# Patient Record
Sex: Female | Born: 1935
Health system: Southern US, Community
[De-identification: ages and names within clinical notes are randomized; demographics above are authoritative.]

## PROBLEM LIST (undated history)

## (undated) DIAGNOSIS — I10 Essential (primary) hypertension: Secondary | ICD-10-CM

## (undated) DIAGNOSIS — I341 Nonrheumatic mitral (valve) prolapse: Secondary | ICD-10-CM

## (undated) HISTORY — PX: ABDOMINAL HYSTERECTOMY: SHX81

## (undated) HISTORY — PX: CHOLECYSTECTOMY: SHX55

## (undated) HISTORY — PX: APPENDECTOMY: SHX54

---

## 2011-09-11 ENCOUNTER — Emergency Department (HOSPITAL_BASED_OUTPATIENT_CLINIC_OR_DEPARTMENT_OTHER)
Admission: EM | Admit: 2011-09-11 | Discharge: 2011-09-11 | Disposition: A | Discharge status: Home / Self Care | Payer: Medicare Other | Attending: Emergency Medicine | Admitting: Emergency Medicine

## 2011-09-11 ENCOUNTER — Encounter (HOSPITAL_BASED_OUTPATIENT_CLINIC_OR_DEPARTMENT_OTHER): Payer: Self-pay | Admitting: *Deleted

## 2011-09-11 DIAGNOSIS — I10 Essential (primary) hypertension: Secondary | ICD-10-CM | POA: Insufficient documentation

## 2011-09-11 DIAGNOSIS — Z23 Encounter for immunization: Secondary | ICD-10-CM | POA: Insufficient documentation

## 2011-09-11 DIAGNOSIS — S0181XA Laceration without foreign body of other part of head, initial encounter: Secondary | ICD-10-CM

## 2011-09-11 DIAGNOSIS — S0180XA Unspecified open wound of other part of head, initial encounter: Secondary | ICD-10-CM | POA: Insufficient documentation

## 2011-09-11 DIAGNOSIS — Z90710 Acquired absence of both cervix and uterus: Secondary | ICD-10-CM | POA: Insufficient documentation

## 2011-09-11 DIAGNOSIS — W1789XA Other fall from one level to another, initial encounter: Secondary | ICD-10-CM | POA: Insufficient documentation

## 2011-09-11 DIAGNOSIS — Z880 Allergy status to penicillin: Secondary | ICD-10-CM | POA: Insufficient documentation

## 2011-09-11 DIAGNOSIS — Z9089 Acquired absence of other organs: Secondary | ICD-10-CM | POA: Insufficient documentation

## 2011-09-11 DIAGNOSIS — I059 Rheumatic mitral valve disease, unspecified: Secondary | ICD-10-CM | POA: Insufficient documentation

## 2011-09-11 HISTORY — DX: Essential (primary) hypertension: I10

## 2011-09-11 HISTORY — DX: Nonrheumatic mitral (valve) prolapse: I34.1

## 2011-09-11 MED ORDER — TETANUS-DIPHTH-ACELL PERTUSSIS 5-2.5-18.5 LF-MCG/0.5 IM SUSP
0.5000 mL | Freq: Once | INTRAMUSCULAR | Status: AC
  Administered 2011-09-11: 0.5 mL via INTRAMUSCULAR
  Filled 2011-09-11: qty 0.5

## 2011-09-11 NOTE — Discharge Instructions (Signed)
Facial Laceration A facial laceration is a cut on the face. It can take 1 to 2 years for the scar to heal completely. HOME CARE  For stitches (sutures):  Keep the cut clean and dry.   If you have a bandage (dressing), change it at least once a day. Change the bandage if it gets wet or dirty, or as told by your doctor.   Wash the cut with soap and water 2 times a day. Rinse the cut with water. Pat it dry with a clean towel.   Put a thin layer of medicated cream on the cut as told by your doctor.   You may shower after the first 24 hours. Do not soak the cut in water until the stitches are removed.   Only take medicines as told by your doctor.   Have your stitches removed as told by your doctor.   Do not wear makeup until a few days after your stitches are removed.  For skin adhesive strips:  Keep the cut clean and dry.   Do not get the strips wet. You may take a bath, but be careful to keep the cut dry.   If the cut gets wet, pat it dry with a clean towel.   The strips will fall off on their own. Do not remove the strips that are still stuck to the cut.  For wound glue:  You may shower or take baths. Do not soak or scrub the cut. Do not swim. Avoid heavy sweating until the glue falls off on its own. After a shower or bath, pat the cut dry with a clean towel.   Do not put medicine or makeup on your cut until the glue falls off.   If you have a bandage, do not put tape over the glue.   Avoid lots of sunlight or tanning lamps until the glue falls off. Put sunscreen on the cut for the first year to reduce your scar.   The glue will fall off on its own. Do not pick at the glue.  You may need a tetanus shot if:  You cannot remember when you had your last tetanus shot.   You have never had a tetanus shot.  If you need a tetanus shot and you choose not to have one, you may get tetanus. Sickness from tetanus can be serious. GET HELP RIGHT AWAY IF:   Your cut gets red, painful,  or puffy (swollen).   There is yellowish-white fluid (pus) coming from the cut.   You have chills or a fever.  MAKE SURE YOU:   Understand these instructions.   Will watch your condition.   Will get help right away if you are not doing well or get worse.  Document Released: 11/03/2007 Document Revised: 05/06/2011 Document Reviewed: 11/10/2010 [employer] Patient Information 2012 [employer]. 

## 2011-09-11 NOTE — ED Notes (Signed)
Pt states [place] and her foot got tangled up in her pocket book strap. Fell and hit head on cement. No LOC. PERL.

## 2011-09-11 NOTE — ED Provider Notes (Signed)
History     CSN: [id number]  Arrival date & time 09/11/11  1310   First MD Initiated Contact with Patient 09/11/11 1324      Chief Complaint  Patient presents with  . Head Injury    (Consider location/radiation/quality/duration/timing/severity/associated sxs/prior treatment) HPI Patient with Katherine Ewing today from car. She states her feet got tangled up in her pocketbook strap. She fell out of the car landing on the cement. She has an abrasion number of her right thigh. She abraded her right knee. She had no loss of consciousness. She denies any other injuries. She was able to stand and ambulate after the episode. She is not on any anticoagulants or antiplatelet therapy.  She does not know when her last tetanus prophylaxis was. Past Medical History  Diagnosis Date  . Hypertension   . MVP (mitral valve prolapse)     Past Surgical History  Procedure Date  . Abdominal hysterectomy   . Appendectomy   . Cholecystectomy     History reviewed. No pertinent family history.  History  Substance Use Topics  . Smoking status: Never Smoker   . Smokeless tobacco: Not on file  . Alcohol Use: No    OB History    Grav Para Term Preterm Abortions TAB SAB Ect Mult Living                  Review of Systems  All other systems reviewed and are negative.    Allergies  Penicillins  Home Medications   Current Outpatient Rx  Name Route Sig Dispense Refill  . BENAZEPRIL-HYDROCHLOROTHIAZIDE PO Oral Take by mouth.      BP 173/74  Pulse 66  Temp(Src) 98.3 F (36.8 C) (Oral)  Resp 20  Ht 5\' 4"  (1.626 m)  Wt 150 lb (68.04 kg)  BMI 25.75 kg/m2  SpO2 98%  Physical Exam  Nursing note and vitals reviewed. Constitutional: She appears well-developed and well-nourished.  HENT:  Head: Normocephalic.    Right Ear: External ear normal.  Left Ear: External ear normal.       2 cm laceration and abrasion to right temple. This is not through and through the skin.  Eyes: Conjunctivae and EOM  are normal. Pupils are equal, round, and reactive to light.  Neck: Normal range of motion. Neck supple.  Cardiovascular: Normal rate, regular rhythm, normal heart sounds and intact distal pulses.   Pulmonary/Chest: Effort normal and breath sounds normal.  Abdominal: Soft. Bowel sounds are normal.  Musculoskeletal: Normal range of motion.  Neurological: She is alert.  Skin: Skin is warm and dry.  Psychiatric: She has a normal mood and affect. Thought content normal.    ED Course  Procedures (including critical care time)  Labs Reviewed - No data to display No results found.   No diagnosis found.    MDM          Hilario Quarry, MD 09/11/11 1416

## 2013-09-25 DIAGNOSIS — E785 Hyperlipidemia, unspecified: Secondary | ICD-10-CM | POA: Diagnosis present

## 2013-09-25 DIAGNOSIS — I10 Essential (primary) hypertension: Secondary | ICD-10-CM | POA: Diagnosis present

## 2013-09-25 DIAGNOSIS — I480 Paroxysmal atrial fibrillation: Secondary | ICD-10-CM | POA: Diagnosis present

## 2018-07-17 DIAGNOSIS — I5023 Acute on chronic systolic (congestive) heart failure: Secondary | ICD-10-CM | POA: Diagnosis present

## 2018-07-17 DIAGNOSIS — I5022 Chronic systolic (congestive) heart failure: Secondary | ICD-10-CM | POA: Diagnosis present

## 2018-10-18 DIAGNOSIS — E039 Hypothyroidism, unspecified: Secondary | ICD-10-CM | POA: Diagnosis present

## 2019-09-28 ENCOUNTER — Emergency Department (HOSPITAL_BASED_OUTPATIENT_CLINIC_OR_DEPARTMENT_OTHER): Payer: Medicare HMO

## 2019-09-28 ENCOUNTER — Other Ambulatory Visit: Payer: Self-pay

## 2019-09-28 ENCOUNTER — Encounter (HOSPITAL_BASED_OUTPATIENT_CLINIC_OR_DEPARTMENT_OTHER): Payer: Self-pay | Admitting: *Deleted

## 2019-09-28 ENCOUNTER — Emergency Department (HOSPITAL_BASED_OUTPATIENT_CLINIC_OR_DEPARTMENT_OTHER)
Admission: EM | Admit: 2019-09-28 | Discharge: 2019-09-28 | Disposition: A | Discharge status: Home / Self Care | Payer: Medicare HMO | Attending: Emergency Medicine | Admitting: Emergency Medicine

## 2019-09-28 DIAGNOSIS — S6992XA Unspecified injury of left wrist, hand and finger(s), initial encounter: Secondary | ICD-10-CM | POA: Diagnosis present

## 2019-09-28 DIAGNOSIS — I10 Essential (primary) hypertension: Secondary | ICD-10-CM | POA: Insufficient documentation

## 2019-09-28 DIAGNOSIS — Y92002 Bathroom of unspecified non-institutional (private) residence single-family (private) house as the place of occurrence of the external cause: Secondary | ICD-10-CM | POA: Diagnosis not present

## 2019-09-28 DIAGNOSIS — Z79899 Other long term (current) drug therapy: Secondary | ICD-10-CM | POA: Diagnosis not present

## 2019-09-28 DIAGNOSIS — Y999 Unspecified external cause status: Secondary | ICD-10-CM | POA: Insufficient documentation

## 2019-09-28 DIAGNOSIS — S62185A Nondisplaced fracture of trapezoid [smaller multangular], left wrist, initial encounter for closed fracture: Secondary | ICD-10-CM | POA: Insufficient documentation

## 2019-09-28 DIAGNOSIS — S62175A Nondisplaced fracture of trapezium [larger multangular], left wrist, initial encounter for closed fracture: Secondary | ICD-10-CM | POA: Insufficient documentation

## 2019-09-28 DIAGNOSIS — W010XXA Fall on same level from slipping, tripping and stumbling without subsequent striking against object, initial encounter: Secondary | ICD-10-CM | POA: Diagnosis not present

## 2019-09-28 DIAGNOSIS — Y9301 Activity, walking, marching and hiking: Secondary | ICD-10-CM | POA: Insufficient documentation

## 2019-09-28 NOTE — ED Notes (Signed)
ED Provider at bedside. 

## 2019-09-28 NOTE — ED Notes (Signed)
Patient transported to X-ray 

## 2019-09-28 NOTE — ED Triage Notes (Signed)
She fell 2 days ago. Does not know why she fell. Injury to her left wrist with significant bruising noted.

## 2019-09-28 NOTE — ED Provider Notes (Signed)
[facility] EMERGENCY DEPARTMENT Provider Note   CSN: [id number] Arrival date & time: 09/28/19  1431     History Chief Complaint  Patient presents with  . Fall  . Wrist Injury    Irania Ewing is a 86 y.o. female.  HPI      Presents with concern for fall Was walking to bathroom from chair and had no problems walking, not dizzy, looked into mirror then fell No dizziness, no syncope, don't think tripped, no problems walking after incident, was able to get up on her own No head trauma, no neck pain, no numbness/weakness/chest pain/shortness of breath Occurred 7PM Wednesday night No headaches Wrist pain has been moderate and persistent, worse with movements. Wants to know if it is broken. Denies other injuries.  Is on blood thinners No other acute concerns    Past Medical History:  Diagnosis Date  . Hypertension   . MVP (mitral valve prolapse)     There are no problems to display for this patient.   Past Surgical History:  Procedure Laterality Date  . ABDOMINAL HYSTERECTOMY    . APPENDECTOMY    . CHOLECYSTECTOMY       OB History   No obstetric history on file.     No family history on file.  Social History   Tobacco Use  . Smoking status: Never Smoker  . Smokeless tobacco: Never Used  Substance Use Topics  . Alcohol use: No  . Drug use: No    Home Medications Prior to Admission medications   Medication Sig Start Date End Date Taking? Authorizing Provider  amiodarone (PACERONE) 200 MG tablet Take by mouth. 06/12/19  Yes [provider]  apixaban (ELIQUIS) 5 MG TABS tablet Take by mouth. 06/12/19  Yes [provider]  diltiazem (TIAZAC) 300 MG 24 hr capsule Take by mouth. 09/17/19  Yes [provider]  dorzolamide-timolol (COSOPT) 22.3-6.8 MG/ML ophthalmic solution INSTILL 1 DROP INTO EACH EYE TWICE DAILY 12/18/18  Yes [provider]  furosemide (LASIX) 40 MG tablet Take by mouth. 12/19/18  Yes [provider]  latanoprost (XALATAN) 0.005 % ophthalmic solution INSTILL 1 DROP INTO EACH EYE NIGHTLY 09/06/18  Yes [provider]  levothyroxine (SYNTHROID) 50 MCG tablet Take by mouth. 07/16/19  Yes [provider]  metoprolol succinate (TOPROL-XL) 100 MG 24 hr tablet Take 1.5 tablets every day 06/12/19  Yes [provider]  ondansetron (ZOFRAN) 8 MG tablet Take by mouth. 12/31/16  Yes [provider]  pilocarpine (PILOCAR) 1 % ophthalmic solution Place 1 drop into the right eye 3 times daily. 08/23/19  Yes [provider]  primidone (MYSOLINE) 250 MG tablet Take by mouth. 03/19/19  Yes [provider]  spironolactone (ALDACTONE) 25 MG tablet Take by mouth. 04/24/19  Yes [provider]  BENAZEPRIL-HYDROCHLOROTHIAZIDE PO Take by mouth.    [provider]    Allergies    Penicillins  Review of Systems   Review of Systems  Constitutional: Negative for fever.  Respiratory: Negative for shortness of breath.   Cardiovascular: Negative for chest pain.  Gastrointestinal: Negative for abdominal pain, nausea and vomiting.  Musculoskeletal: Positive for arthralgias. Negative for back pain and neck pain.  Skin: Positive for color change (bruising). Negative for wound.  Neurological: Negative for dizziness, seizures, syncope, facial asymmetry, weakness, light-headedness, numbness and headaches.    Physical Exam Updated Vital Signs BP 120/79 (BP Location: Right Arm)   Pulse 61   Temp 98.5 F (36.9 C) (  Oral)   Resp 20   Ht 5\' 2"  (1.575 m)   Wt 59 kg   SpO2 97%   BMI 23.78 kg/m   Physical Exam Vitals and nursing note reviewed.  Constitutional:      General: She is not in acute distress.    Appearance: She is well-developed. She is not diaphoretic.  HENT:     Head: Normocephalic and atraumatic.  Eyes:     Conjunctiva/sclera: Conjunctivae normal.  Cardiovascular:     Rate and Rhythm: Normal rate and regular rhythm.      Pulses: Normal pulses.  Pulmonary:     Effort: Pulmonary effort is normal. No respiratory distress.  Musculoskeletal:        General: Tenderness (right carpal bones on radial side of hand, mild snuff box tenderness, contusion over hand/wrist, no deformity, normal ROM, normal sensation of fingers, normal opponens/finger abduction/extension wrist) present.     Cervical back: Normal range of motion. No tenderness.  Skin:    General: Skin is warm and dry.     Findings: Bruising present. No erythema or rash.  Neurological:     Mental Status: She is alert and oriented to person, place, and time.     ED Results / Procedures / Treatments   Labs (all labs ordered are listed, but only abnormal results are displayed) Labs Reviewed - No data to display  EKG None  Radiology DG Wrist Complete Left  Result Date: 09/28/2019 CLINICAL DATA:  Fall EXAM: LEFT WRIST - COMPLETE 3+ VIEW COMPARISON:  None. FINDINGS: There is a background of advanced degenerative arthrosis at the first carpometacarpal and triscaphe joints with slight volar positioning of the trapezium and trapezoid. As well as some questionable fragmentation near the base of the second metatarsal. No other acute osseous abnormality is evident. More mild degenerative changes at the radiocarpal joint are noted. There is slight negative ulnar variance. Interphalangeal arthrosis is present as well noted incidentally on these images. Mild circumferential swelling of the wrist is seen. IMPRESSION: Advanced degenerative changes may limit detection of small, nondisplaced fractures. Question some irregularity of the trapezium and trapezoid. Could be further assessed with cross-sectional imaging. Electronically Signed   By: Lovena Le M.D.   On: 09/28/2019 15:32    Procedures Procedures (including critical care time)  Medications Ordered in ED Medications - No data to display  ED Course  I have reviewed the triage vital signs and the nursing  notes.  Pertinent labs & imaging results that were available during my care of the patient were reviewed by me and considered in my medical decision making (see chart for details).    MDM Rules/Calculators/A&P                      86yo female with history above of eliquis presents with concern for fall 3 days ago with left wrist pain.  No head trauma, headache and 3 days after event and have low suspicion for intracranial bleed.  No history or physical findings to suggest other injuries, including low suspicion for cervical spine injury.  Wrist NV intact. XR shows possible fx/irregularities of trapezoid and trapezium with limitation of osteopenia and arthritis. She does have tenderness in this area and suspect these likely represent fracture and will place in thumb spica and refer to hand surgery for further evaluation. Patient discharged in stable condition with understanding of reasons to return.   Final Clinical Impression(s) / ED Diagnoses Final diagnoses:  Closed nondisplaced fracture of  trapezium of left wrist, initial encounter  Closed nondisplaced fracture of trapezoid of left wrist, initial encounter    Rx / DC Orders ED Discharge Orders    None       Alvira Monday, MD 09/28/19 1821

## 2019-10-22 ENCOUNTER — Emergency Department (HOSPITAL_BASED_OUTPATIENT_CLINIC_OR_DEPARTMENT_OTHER)
Admission: EM | Admit: 2019-10-22 | Discharge: 2019-10-22 | Disposition: A | Discharge status: Home / Self Care | Payer: Medicare HMO | Attending: Emergency Medicine | Admitting: Emergency Medicine

## 2019-10-22 ENCOUNTER — Other Ambulatory Visit: Payer: Self-pay

## 2019-10-22 ENCOUNTER — Emergency Department (HOSPITAL_BASED_OUTPATIENT_CLINIC_OR_DEPARTMENT_OTHER): Payer: Medicare HMO

## 2019-10-22 ENCOUNTER — Encounter (HOSPITAL_BASED_OUTPATIENT_CLINIC_OR_DEPARTMENT_OTHER): Payer: Self-pay | Admitting: Emergency Medicine

## 2019-10-22 DIAGNOSIS — Z7901 Long term (current) use of anticoagulants: Secondary | ICD-10-CM | POA: Insufficient documentation

## 2019-10-22 DIAGNOSIS — M79642 Pain in left hand: Secondary | ICD-10-CM | POA: Diagnosis present

## 2019-10-22 DIAGNOSIS — Z880 Allergy status to penicillin: Secondary | ICD-10-CM | POA: Diagnosis not present

## 2019-10-22 DIAGNOSIS — Z79899 Other long term (current) drug therapy: Secondary | ICD-10-CM | POA: Insufficient documentation

## 2019-10-22 MED ORDER — OXYCODONE-ACETAMINOPHEN 5-325 MG PO TABS
1.0000 | ORAL_TABLET | Freq: Once | ORAL | Status: AC
  Administered 2019-10-22: 1 via ORAL
  Filled 2019-10-22: qty 1

## 2019-10-22 MED ORDER — TRAMADOL HCL 50 MG PO TABS: 50.0000 mg | ORAL_TABLET | Freq: Four times a day (QID) | ORAL | 0 refills | Status: DC | PRN

## 2019-10-22 MED ORDER — PREDNISONE 20 MG PO TABS: 40.0000 mg | ORAL_TABLET | Freq: Every day | ORAL | 0 refills | Status: DC

## 2019-10-22 MED FILL — traMADol HCL 50 MG TABS: 50 | 3 days supply | Qty: 15 | Fill #0

## 2019-10-22 MED FILL — predniSONE 20 MG TABS: 20 | 5 days supply | Qty: 10 | Fill #0

## 2019-10-22 NOTE — ED Triage Notes (Signed)
Pt states her arm pain started to get worse on Saturday.  No new injuries.  Pt did not follow up with ortho.

## 2019-10-22 NOTE — ED Provider Notes (Signed)
[facility] EMERGENCY DEPARTMENT Provider Note   CSN: [id number] Arrival date & time: 10/22/19  1009     History Chief Complaint  Patient presents with  . Arm Pain    Katherine Ewing is a 86 y.o. female.  HPI 86 year old Caucasian female with past medical history significant for hypertension presents the ER for evaluation of left hand pain.  Patient was seen in the ER on 4/30 after mechanical fall where she hit her left hand.  Patient had a questionable left hand fracture at that time.  She was placed into a Velcro splint for [place] to follow-up with orthopedics.  Patient reports that she did not follow-up with orthopedics.  She reports that she was feeling better until 2 days ago when she started having intense pain to her left hand.  Has been taken Tylenol without any significant relief.  No new injuries that she knows of.  No numbness or tingling.  Patient states that she has been wearing her brace at home.  Patient reports the pain is excruciating.  She rates the pain a 30/10.  Denies any fevers or chills.  States the pain is worse with palpation and range of motion.    Past Medical History:  Diagnosis Date  . Hypertension   . MVP (mitral valve prolapse)     There are no problems to display for this patient.   Past Surgical History:  Procedure Laterality Date  . ABDOMINAL HYSTERECTOMY    . APPENDECTOMY    . CHOLECYSTECTOMY       OB History   No obstetric history on file.     No family history on file.  Social History   Tobacco Use  . Smoking status: Never Smoker  . Smokeless tobacco: Never Used  Substance Use Topics  . Alcohol use: No  . Drug use: No    Home Medications Prior to Admission medications   Medication Sig Start Date End Date Taking? Authorizing Provider  amiodarone (PACERONE) 200 MG tablet Take by mouth. 06/12/19   [provider]  apixaban (ELIQUIS) 5 MG TABS tablet Take by mouth. 06/12/19   [provider]    BENAZEPRIL-HYDROCHLOROTHIAZIDE PO Take by mouth.    [provider]  diltiazem (TIAZAC) 300 MG 24 hr capsule Take by mouth. 09/17/19   [provider]  dorzolamide-timolol (COSOPT) 22.3-6.8 MG/ML ophthalmic solution INSTILL 1 DROP INTO EACH EYE TWICE DAILY 12/18/18   [provider]  furosemide (LASIX) 40 MG tablet Take by mouth. 12/19/18   [provider]  latanoprost (XALATAN) 0.005 % ophthalmic solution INSTILL 1 DROP INTO EACH EYE NIGHTLY 09/06/18   [provider]  levothyroxine (SYNTHROID) 50 MCG tablet Take by mouth. 07/16/19   [provider]  metoprolol succinate (TOPROL-XL) 100 MG 24 hr tablet Take 1.5 tablets every day 06/12/19   [provider]  ondansetron (ZOFRAN) 8 MG tablet Take by mouth. 12/31/16   [provider]  pilocarpine (PILOCAR) 1 % ophthalmic solution Place 1 drop into the right eye 3 times daily. 08/23/19   [provider]  primidone (MYSOLINE) 250 MG tablet Take by mouth. 03/19/19   [provider]  spironolactone (ALDACTONE) 25 MG tablet Take by mouth. 04/24/19   [provider]    Allergies    Penicillins  Review of Systems   Review of Systems  Constitutional: Negative for chills and fever.  HENT: Negative for congestion.   Eyes: Negative for discharge.  Gastrointestinal: Negative for nausea and vomiting.  Musculoskeletal: Positive for arthralgias and myalgias.  Skin: Negative for color change.  Neurological: Negative for weakness and numbness.  Psychiatric/Behavioral: Negative for confusion.    Physical Exam Updated Vital Signs BP (!) 141/77 (BP Location: Right Arm)   Pulse 77   Temp 98.7 F (37.1 C) (Oral)   Resp 18   Ht 5\' 2"  (1.575 m)   Wt 59 kg   SpO2 96%   BMI 23.78 kg/m   Physical Exam Vitals and nursing note reviewed.  Constitutional:      General: She is not in acute distress.    Appearance: She is well-developed. She is not ill-appearing or  toxic-appearing.  HENT:     Head: Normocephalic and atraumatic.  Eyes:     General: No scleral icterus.       Right eye: No discharge.        Left eye: No discharge.  Cardiovascular:     Pulses: Normal pulses.  Pulmonary:     Effort: No respiratory distress.  Musculoskeletal:        General: Normal range of motion.     Cervical back: Normal range of motion.     Comments: Patient in Velcro splint.  This was removed.  Patient does have swelling to the dorsum of the left hand.  There is no erythema or warmth.  Radial pulses are 2+.  Capillary refill is normal.  Sensation is intact.  Patient has pain with range of motion and palpation over the left wrist and left hand.   No pain with range of motion or palpation of the forearm or elbow.  Skin compartments are soft.   Skin:    General: Skin is warm and dry.     Coloration: Skin is not pale.  Neurological:     Mental Status: She is alert.  Psychiatric:        Behavior: Behavior normal.        Thought Content: Thought content normal.        Judgment: Judgment normal.     ED Results / Procedures / Treatments   Labs (all labs ordered are listed, but only abnormal results are displayed) Labs Reviewed - No data to display  EKG None  Radiology DG Hand Complete Left  Result Date: 10/22/2019 CLINICAL DATA:  Left hand pain EXAM: LEFT HAND - COMPLETE 3+ VIEW COMPARISON:  09/28/2019 FINDINGS: Advanced osteoarthritis at the 1st carpometacarpal joint and in the DIP joints. Irregularity noted in the trapezium likely degenerative. No visible acute fracture, subluxation or dislocation. IMPRESSION: No acute bony abnormality. Electronically Signed   By: 09/30/2019 M.D.   On: 10/22/2019 10:55    Procedures Procedures (including critical care time)  Medications Ordered in ED Medications  oxyCODONE-acetaminophen (PERCOCET/ROXICET) 5-325 MG per tablet 1 tablet (has no administration in time range)    ED Course  I have reviewed the triage  vital signs and the nursing notes.  Pertinent labs & imaging results that were available during my care of the patient were reviewed by me and considered in my medical decision making (see chart for details).    MDM Rules/Calculators/A&P                      86 year old presents the ER for evaluation of of left hand pain.  Injury 2 weeks ago.  Did not follow-up with orthopedist.  Patient has been wearing splint.  Pain improved and then worsened 2 days ago.  Patient is neurovascularly intact.  There  is no erythema or warmth and no systemic signs of infection to suspect a septic arthritis.  No history of gout.  The patient's x-ray today shows no acute fracture or malalignment.  Patient does have some swelling to the dorsum of the left hand.  Patient will need orthopedic hand follow-up.  Have given a referral.  Case was discussed with my attending Dr. Wilson Singer.  He recommends patient placed on a short course of prednisone.  I will also give patient short course of pain medication.  Narcotic database was reviewed for the past 12 months.  She did receive tramadol several months ago.  Will start patient on tramadol along with prednisone.  Discussed with patient that tramadol can make her drowsy is to be careful taking this and make sure that she has somebody at home who can watch her.  She will be at increased fall risk with narcotic medications.  Patient verbalized understanding.  Pt is hemodynamically stable, in NAD, & able to ambulate in the ED. Evaluation does not show pathology that would require ongoing emergent intervention or inpatient treatment. I explained the diagnosis to the patient. Pain has been managed & has no complaints prior to dc. Pt is comfortable with above plan and is stable for discharge at this time. All questions were answered prior to disposition. Strict return precautions for f/u to the ED were discussed. Encouraged follow up with PCP.  Final Clinical Impression(s) / ED Diagnoses Final  diagnoses:  Left hand pain    Rx / DC Orders ED Discharge Orders         Ordered    traMADol (ULTRAM) 50 MG tablet  Every 6 hours PRN,   Status:  Discontinued     10/22/19 1131    predniSONE (DELTASONE) 20 MG tablet  Daily,   Status:  Discontinued     10/22/19 1131    predniSONE (DELTASONE) 20 MG tablet  Daily     10/22/19 1132    traMADol (ULTRAM) 50 MG tablet  Every 6 hours PRN     10/22/19 1132           Aaron Edelman 10/22/19 1233    Virgel Manifold, MD 10/22/19 1335

## 2019-10-22 NOTE — Discharge Instructions (Signed)
Your x-ray did not show any fracture today.  Wear the splint for comfort.  Continue to ice and elevate the area.  We will give a short course of pain medication.  This medication can make you drowsy to be careful with taking it.  Make sure that semiis home watching when take this medication we will also give you steroids to help with inflammation.  Please call the hand doctor today to follow-up.  Return to the ER if you develop any worsening symptoms including worsening pain, fevers or for any other reason.

## 2019-10-22 NOTE — ED Notes (Signed)
ED Provider at bedside. Family at bedside.  

## 2019-10-27 ENCOUNTER — Other Ambulatory Visit: Payer: Self-pay

## 2019-10-27 ENCOUNTER — Encounter (HOSPITAL_BASED_OUTPATIENT_CLINIC_OR_DEPARTMENT_OTHER): Payer: Self-pay | Admitting: Emergency Medicine

## 2019-10-27 DIAGNOSIS — W19XXXD Unspecified fall, subsequent encounter: Secondary | ICD-10-CM | POA: Insufficient documentation

## 2019-10-27 DIAGNOSIS — Z79899 Other long term (current) drug therapy: Secondary | ICD-10-CM | POA: Diagnosis not present

## 2019-10-27 DIAGNOSIS — M79632 Pain in left forearm: Secondary | ICD-10-CM | POA: Diagnosis present

## 2019-10-27 DIAGNOSIS — S52552D Other extraarticular fracture of lower end of left radius, subsequent encounter for closed fracture with routine healing: Secondary | ICD-10-CM | POA: Diagnosis not present

## 2019-10-27 DIAGNOSIS — G5612 Other lesions of median nerve, left upper limb: Secondary | ICD-10-CM | POA: Diagnosis not present

## 2019-10-27 DIAGNOSIS — I10 Essential (primary) hypertension: Secondary | ICD-10-CM | POA: Diagnosis not present

## 2019-10-27 NOTE — ED Triage Notes (Signed)
Pt states she broke her L arm 3 weeks ago. States "it wasn't that bad of a break but I damaged the nerves in my arm so bad." Pt states she was seen on Wednesday morning and received "an injection" that really helped but it didn't last.

## 2019-10-28 ENCOUNTER — Emergency Department (HOSPITAL_BASED_OUTPATIENT_CLINIC_OR_DEPARTMENT_OTHER): Payer: Medicare HMO

## 2019-10-28 ENCOUNTER — Emergency Department (HOSPITAL_BASED_OUTPATIENT_CLINIC_OR_DEPARTMENT_OTHER)
Admission: EM | Admit: 2019-10-28 | Discharge: 2019-10-28 | Disposition: A | Discharge status: Home / Self Care | Payer: Medicare HMO | Attending: Emergency Medicine | Admitting: Emergency Medicine

## 2019-10-28 DIAGNOSIS — G5612 Other lesions of median nerve, left upper limb: Secondary | ICD-10-CM

## 2019-10-28 DIAGNOSIS — S52552S Other extraarticular fracture of lower end of left radius, sequela: Secondary | ICD-10-CM

## 2019-10-28 MED ORDER — HYDROCODONE-ACETAMINOPHEN 5-325 MG PO TABS: 0.5000 | ORAL_TABLET | ORAL | 0 refills | Status: DC | PRN

## 2019-10-28 MED ORDER — BUPIVACAINE-EPINEPHRINE (PF) 0.5% -1:200000 IJ SOLN
1.8000 mL | Freq: Once | INTRAMUSCULAR | Status: AC
Start: 2019-10-28 — End: 2019-10-28
  Administered 2019-10-28: 1.8 mL
  Filled 2019-10-28: qty 1.8

## 2019-10-28 MED ORDER — HYDROCODONE-ACETAMINOPHEN 5-325 MG PO TABS
0.5000 | ORAL_TABLET | Freq: Once | ORAL | Status: AC
  Administered 2019-10-28: 0.5 via ORAL
  Filled 2019-10-28: qty 1

## 2019-10-28 NOTE — ED Notes (Signed)
Pt taken to CT and returned 

## 2019-10-28 NOTE — ED Notes (Signed)
Dr. Molpus, ED Provider at bedside. 

## 2019-10-28 NOTE — ED Provider Notes (Signed)
[facility] Provider Note: Lowella Dell, MD, FACEP  CSN: [id number] MRN: [id number] ARRIVAL: 10/27/19 at 2221 ROOM: MH02/MH02   CHIEF COMPLAINT  Arm Pain   HISTORY OF PRESENT ILLNESS  10/28/19 12:47 AM Katherine Ewing is a 86 y.o. female who fell 09/28/2019 and was diagnosed with a left wrist injury.  Initial radiographs were suspicious for fractures of the trapezium and trapezius.  CT scan was suggested but was not done.  Follow-up radiographs on 10/22/2019 were negative for fracture.  She has had persistent pain in her left median nerve distribution (first second and third fingers) sometimes radiating proximally since the injury.  She had an injection of her medial wrist (the location the patient indicates suggest the median nerve) by Dr. Roney Mans 3 days ago.  This gave her significant relief of pain but the pain is returned and she now rates it as a "20 or 30 out of 10".  She has a Lidoderm patch on her dorsal left hand which she states helps significantly.  Past Medical History:  Diagnosis Date  . Hypertension   . MVP (mitral valve prolapse)     Past Surgical History:  Procedure Laterality Date  . ABDOMINAL HYSTERECTOMY    . APPENDECTOMY    . CHOLECYSTECTOMY      No family history on file.  Social History   Tobacco Use  . Smoking status: Never Smoker  . Smokeless tobacco: Never Used  Substance Use Topics  . Alcohol use: No  . Drug use: No    Prior to Admission medications   Medication Sig Start Date End Date Taking? Authorizing Provider  amiodarone (PACERONE) 200 MG tablet Take by mouth. 06/12/19   [provider]  apixaban (ELIQUIS) 5 MG TABS tablet Take by mouth. 06/12/19   [provider]  BENAZEPRIL-HYDROCHLOROTHIAZIDE PO Take by mouth.    [provider]  diltiazem (TIAZAC) 300 MG 24 hr capsule Take by mouth. 09/17/19   [provider]  dorzolamide-timolol (COSOPT) 22.3-6.8 MG/ML ophthalmic solution INSTILL 1 DROP INTO  EACH EYE TWICE DAILY 12/18/18   [provider]  furosemide (LASIX) 40 MG tablet Take by mouth. 12/19/18   [provider]  HYDROcodone-acetaminophen (NORCO) 5-325 MG tablet Take 0.5 tablets by mouth every 4 (four) hours as needed for severe pain. 10/28/19   Esme Durkin, MD  latanoprost (XALATAN) 0.005 % ophthalmic solution INSTILL 1 DROP INTO EACH EYE NIGHTLY 09/06/18   [provider]  levothyroxine (SYNTHROID) 50 MCG tablet Take by mouth. 07/16/19   [provider]  metoprolol succinate (TOPROL-XL) 100 MG 24 hr tablet Take 1.5 tablets every day 06/12/19   [provider]  ondansetron (ZOFRAN) 8 MG tablet Take by mouth. 12/31/16   [provider]  pilocarpine (PILOCAR) 1 % ophthalmic solution Place 1 drop into the right eye 3 times daily. 08/23/19   [provider]  predniSONE (DELTASONE) 20 MG tablet Take 2 tablets (40 mg total) by mouth daily. 10/22/19   Rise Mu, PA-C  primidone (MYSOLINE) 250 MG tablet Take by mouth. 03/19/19   [provider]  spironolactone (ALDACTONE) 25 MG tablet Take by mouth. 04/24/19   [provider]    Allergies Penicillins and Adhesive  [tape]   REVIEW OF SYSTEMS  Negative except as noted here or in the History of Present Illness.   PHYSICAL EXAMINATION  Initial Vital Signs Blood pressure 123/85, pulse 83, temperature 98.8 F (37.1 C), temperature source Oral, resp. rate 20, height 5'  2" (1.575 m), weight 59 kg, SpO2 97 %.  Examination General: Well-developed, well-nourished female in no acute distress; appearance consistent with age of record HENT: normocephalic; atraumatic Eyes: pupils equal, round and reactive to light; extraocular muscles intact Neck: supple Heart: regular rate and rhythm Lungs: clear to auscultation bilaterally Abdomen: soft; nondistended; nontender; bowel sounds present Extremities: No deformity; left wrist in Velcro splint, on removal patient has  normal movement and appearance of fingers with pain in the median nerve distribution, negative Tinel's test; normal pulses; trace edema of lower legs Neurologic: Awake, alert and oriented; motor function intact in all extremities and symmetric; no facial droop Skin: Warm and dry Psychiatric: Angry mood with congruent affect   RESULTS  Summary of this visit's results, reviewed and interpreted by myself:   EKG Interpretation  Date/Time:    Ventricular Rate:    PR Interval:    QRS Duration:   QT Interval:    QTC Calculation:   R Axis:     Text Interpretation:        Laboratory Studies: No results found for this or any previous visit (from the past 24 hour(s)). Imaging Studies: CT Wrist Left Wo Contrast  Result Date: 10/28/2019 CLINICAL DATA:  Injury 09/28/2019, persistent pain. EXAM: CT OF THE LEFT WRIST WITHOUT CONTRAST TECHNIQUE: Multidetector CT imaging was performed according to the standard protocol. Multiplanar CT image reconstructions were also generated. COMPARISON:  Radiograph 09/28/2019, hand radiograph 10/22/2019 FINDINGS: Bones/Joint/Cartilage Transverse sclerosis involving the distal radius most consistent with healing nondisplaced fracture. This is in the region of the physeal scar and extends to the articular surface. Minimal nondisplaced fracture line may be present involving the radial styloid. No additional acute or healing fracture. There is osteoarthritis of the base of the thumb involving the carpal metacarpal joint. Minimal wrist joint effusion. No evidence of erosion or bony destruction. Ligaments Suboptimally assessed by CT. Muscles and Tendons No significant tenosynovial fluid.  No intramuscular collection. Soft tissues Mild soft tissue edema about the wrist. Possible dressing overlying the dorsum. IMPRESSION: 1. Transverse sclerosis involving the distal radius most consistent with healing nondisplaced fracture. This is in the region of the physeal scar and extends to  the articular surface. A nondisplaced fracture line may be present involving the radial styloid. 2. Osteoarthritis of the base of the thumb involving the carpal metacarpal joint. 3. Minimal wrist joint effusion. Electronically Signed   By: Keith Rake M.D.   On: 10/28/2019 01:48    ED COURSE and MDM  Nursing notes, initial and subsequent vitals signs, including pulse oximetry, reviewed and interpreted by myself.  Vitals:   10/27/19 2232 10/27/19 2234 10/28/19 0134  BP: 123/85  (!) 134/98  Pulse: 83  83  Resp: 20  20  Temp: 98.8 F (37.1 C)    TempSrc: Oral    SpO2: 97%  98%  Weight:  59 kg   Height:  5\' 2"  (1.575 m)    Medications  bupivacaine-epinephrine (MARCAINE W/ EPI) 0.5% -1:200000 injection 1.8 mL (has no administration in time range)  HYDROcodone-acetaminophen (NORCO/VICODIN) 5-325 MG per tablet 0.5 tablet (0.5 tablets Oral Given 10/28/19 0133)   2:21 AM Patient's pain improved after median nerve block.  CT consistent with healing distal radius fracture.  Patient not getting relief with tramadol, will switch to low-dose hydrocodone with advised that she may wish to take a laxative to prevent constipation.   PROCEDURES  Procedures  MEDIAN NERVE BLOCK After informed verbal consent was obtained, 1.8 milliliters of 0.5%  bupivacaine with epinephrine were injected into left volar wrist between the flexor carpi radialis and flexor profundus digitorum tendons. The patient tolerated this well and there were no immediate complications. Adequate analgesia was obtained.   ED DIAGNOSES     ICD-10-CM   1. Other closed extra-articular fracture of distal end of left radius, sequela  S52.552S   2. Median nerve neuritis, left  G56.12        Kasee Hantz, Jonny Ruiz, MD 10/28/19 [phone]

## 2020-01-30 ENCOUNTER — Encounter (HOSPITAL_BASED_OUTPATIENT_CLINIC_OR_DEPARTMENT_OTHER): Payer: Self-pay | Admitting: Emergency Medicine

## 2020-01-30 ENCOUNTER — Observation Stay (HOSPITAL_BASED_OUTPATIENT_CLINIC_OR_DEPARTMENT_OTHER)
Admission: EM | Admit: 2020-01-30 | Discharge: 2020-02-01 | Disposition: A | Discharge status: Home / Self Care | Payer: Medicare HMO | Attending: Internal Medicine | Admitting: Internal Medicine

## 2020-01-30 ENCOUNTER — Other Ambulatory Visit: Payer: Self-pay

## 2020-01-30 DIAGNOSIS — I480 Paroxysmal atrial fibrillation: Secondary | ICD-10-CM | POA: Diagnosis present

## 2020-01-30 DIAGNOSIS — E785 Hyperlipidemia, unspecified: Secondary | ICD-10-CM | POA: Insufficient documentation

## 2020-01-30 DIAGNOSIS — Z79899 Other long term (current) drug therapy: Secondary | ICD-10-CM | POA: Insufficient documentation

## 2020-01-30 DIAGNOSIS — Z9049 Acquired absence of other specified parts of digestive tract: Secondary | ICD-10-CM | POA: Diagnosis not present

## 2020-01-30 DIAGNOSIS — E039 Hypothyroidism, unspecified: Secondary | ICD-10-CM | POA: Insufficient documentation

## 2020-01-30 DIAGNOSIS — Z20822 Contact with and (suspected) exposure to covid-19: Secondary | ICD-10-CM | POA: Diagnosis not present

## 2020-01-30 DIAGNOSIS — K921 Melena: Secondary | ICD-10-CM | POA: Diagnosis not present

## 2020-01-30 DIAGNOSIS — K625 Hemorrhage of anus and rectum: Secondary | ICD-10-CM

## 2020-01-30 DIAGNOSIS — I5022 Chronic systolic (congestive) heart failure: Secondary | ICD-10-CM | POA: Insufficient documentation

## 2020-01-30 DIAGNOSIS — I10 Essential (primary) hypertension: Secondary | ICD-10-CM | POA: Diagnosis present

## 2020-01-30 DIAGNOSIS — K5730 Diverticulosis of large intestine without perforation or abscess without bleeding: Secondary | ICD-10-CM | POA: Insufficient documentation

## 2020-01-30 DIAGNOSIS — I110 Hypertensive heart disease with heart failure: Secondary | ICD-10-CM | POA: Insufficient documentation

## 2020-01-30 DIAGNOSIS — I5023 Acute on chronic systolic (congestive) heart failure: Secondary | ICD-10-CM | POA: Diagnosis present

## 2020-01-30 LAB — CBC
HCT: 38.6 % (ref 36.0–46.0)
Hemoglobin: 13 g/dL (ref 12.0–15.0)
MCH: 33.9 pg (ref 26.0–34.0)
MCHC: 33.7 g/dL (ref 30.0–36.0)
MCV: 100.5 fL — ABNORMAL HIGH (ref 80.0–100.0)
Platelets: 129 10*3/uL — ABNORMAL LOW (ref 150–400)
RBC: 3.84 MIL/uL — ABNORMAL LOW (ref 3.87–5.11)
RDW: 15.4 % (ref 11.5–15.5)
WBC: 5.3 10*3/uL (ref 4.0–10.5)
nRBC: 0 % (ref 0.0–0.2)

## 2020-01-30 LAB — SARS CORONAVIRUS 2 BY RT PCR (HOSPITAL ORDER, PERFORMED IN ~~LOC~~ HOSPITAL LAB): SARS Coronavirus 2: NEGATIVE

## 2020-01-30 LAB — COMPREHENSIVE METABOLIC PANEL
ALT: 19 U/L (ref 0–44)
AST: 21 U/L (ref 15–41)
Albumin: 3.7 g/dL (ref 3.5–5.0)
Alkaline Phosphatase: 48 U/L (ref 38–126)
Anion gap: 11 (ref 5–15)
BUN: 14 mg/dL (ref 8–23)
CO2: 24 mmol/L (ref 22–32)
Calcium: 8.9 mg/dL (ref 8.9–10.3)
Chloride: 102 mmol/L (ref 98–111)
Creatinine, Ser: 1.11 mg/dL — ABNORMAL HIGH (ref 0.44–1.00)
GFR calc Af Amer: 52 mL/min — ABNORMAL LOW (ref >60)
GFR calc non Af Amer: 45 mL/min — ABNORMAL LOW (ref >60)
Glucose, Bld: 96 mg/dL (ref 70–99)
Potassium: 4 mmol/L (ref 3.5–5.1)
Sodium: 137 mmol/L (ref 135–145)
Total Bilirubin: 0.4 mg/dL (ref 0.3–1.2)
Total Protein: 6.8 g/dL (ref 6.5–8.1)

## 2020-01-30 LAB — OCCULT BLOOD X 1 CARD TO LAB, STOOL: Fecal Occult Bld: POSITIVE — AB

## 2020-01-30 MED ORDER — METOPROLOL TARTRATE 50 MG PO TABS
100.0000 mg | ORAL_TABLET | Freq: Two times a day (BID) | ORAL | Status: DC
  Administered 2020-01-30: 100 mg via ORAL
  Filled 2020-01-30 (×2): qty 4

## 2020-01-30 MED ORDER — ACETAMINOPHEN 325 MG PO TABS
650.0000 mg | ORAL_TABLET | Freq: Once | ORAL | Status: AC
  Administered 2020-01-30: 650 mg via ORAL
  Filled 2020-01-30: qty 2

## 2020-01-30 MED ORDER — LEVOTHYROXINE SODIUM 50 MCG PO TABS
75.0000 ug | ORAL_TABLET | Freq: Every day | ORAL | Status: DC
  Administered 2020-01-31 – 2020-02-01 (×2): 75 ug via ORAL
  Filled 2020-01-30 (×2): qty 1
  Filled 2020-01-30: qty 3

## 2020-01-30 MED ORDER — SPIRONOLACTONE 25 MG PO TABS
25.0000 mg | ORAL_TABLET | Freq: Every day | ORAL | Status: DC
  Administered 2020-01-30 – 2020-02-01 (×2): 25 mg via ORAL
  Filled 2020-01-30 (×3): qty 1

## 2020-01-30 MED ORDER — DILTIAZEM HCL 30 MG PO TABS
300.0000 mg | ORAL_TABLET | Freq: Once | ORAL | Status: AC
  Administered 2020-01-30: 300 mg via ORAL
  Filled 2020-01-30: qty 10

## 2020-01-30 MED ORDER — PRIMIDONE 250 MG PO TABS
250.0000 mg | ORAL_TABLET | Freq: Every day | ORAL | Status: DC
  Administered 2020-01-30 – 2020-01-31 (×2): 250 mg via ORAL
  Filled 2020-01-30 (×3): qty 1

## 2020-01-30 MED ORDER — AMIODARONE HCL 200 MG PO TABS
200.0000 mg | ORAL_TABLET | Freq: Every day | ORAL | Status: DC
  Administered 2020-01-30 – 2020-02-01 (×2): 200 mg via ORAL
  Filled 2020-01-30 (×3): qty 1

## 2020-01-30 MED ORDER — PILOCARPINE HCL 2 % OP SOLN
1.0000 [drp] | Freq: Once | OPHTHALMIC | Status: DC
  Filled 2020-01-30: qty 15

## 2020-01-30 NOTE — ED Triage Notes (Signed)
Pt states she is having rectal bleeding  States it started yesterday morning after she had a BM and she says she bled a lot but then it quit  Pt states this morning when she got up she had blood on her sheets  Pt states the blood is bright red  Pt states she is weak  Denies any pain

## 2020-01-30 NOTE — ED Provider Notes (Addendum)
[facility] EMERGENCY DEPARTMENT Provider Note   CSN: [id number] Arrival date & time: 01/30/20  3244    History Chief Complaint  Patient presents with  . Rectal Bleeding   Katherine Ewing is a 86 y.o. female.  [name] is an otherwise well 86 year old female who has history of hemorrhoids and on Eliquis for MVP.  She is presenting today for bright red blood per rectum but had 1 occurrence yesterday after straining with a bowel movement that ceased after applying pressure.  She had no pain without bleeding rectal or abdominal.  When she woke up this morning she discovered there was bright red blood in her bed from her rectum again but again had no pain.  She denies use of any over-the-counter ibuprofen, Advil, aspirin, etc.  She will occasionally use Tylenol.  Her last colonoscopy was approximately 5 years ago and was normal with no need for follow-up.  She states that she has had an episode of a bleeding hemorrhoid in the distant past which was not this severe.    Past Medical History:  Diagnosis Date  . Hypertension   . MVP (mitral valve prolapse)    There are no problems to display for this patient.  Past Surgical History:  Procedure Laterality Date  . ABDOMINAL HYSTERECTOMY    . APPENDECTOMY    . CHOLECYSTECTOMY      OB History   No obstetric history on file.    History reviewed. No pertinent family history.  Social History   Tobacco Use  . Smoking status: Never Smoker  . Smokeless tobacco: Never Used  Vaping Use  . Vaping Use: Never used  Substance Use Topics  . Alcohol use: No  . Drug use: No   Home Medications Prior to Admission medications   Medication Sig Start Date End Date Taking? Authorizing Provider  amiodarone (PACERONE) 200 MG tablet Take by mouth. 06/12/19   [provider]  apixaban (ELIQUIS) 5 MG TABS tablet Take by mouth. 06/12/19   [provider]  BENAZEPRIL-HYDROCHLOROTHIAZIDE PO Take by mouth.    [provider]    diltiazem (TIAZAC) 300 MG 24 hr capsule Take by mouth. 09/17/19   [provider]  dorzolamide-timolol (COSOPT) 22.3-6.8 MG/ML ophthalmic solution INSTILL 1 DROP INTO EACH EYE TWICE DAILY 12/18/18   [provider]  furosemide (LASIX) 40 MG tablet Take by mouth. 12/19/18   [provider]  HYDROcodone-acetaminophen (NORCO) 5-325 MG tablet Take 0.5 tablets by mouth every 4 (four) hours as needed for severe pain. 10/28/19   Molpus, John, MD  latanoprost (XALATAN) 0.005 % ophthalmic solution INSTILL 1 DROP INTO EACH EYE NIGHTLY 09/06/18   [provider]  levothyroxine (SYNTHROID) 50 MCG tablet Take by mouth. 07/16/19   [provider]  metoprolol succinate (TOPROL-XL) 100 MG 24 hr tablet Take 1.5 tablets every day 06/12/19   [provider]  ondansetron (ZOFRAN) 8 MG tablet Take by mouth. 12/31/16   [provider]  pilocarpine (PILOCAR) 1 % ophthalmic solution Place 1 drop into the right eye 3 times daily. 08/23/19   [provider]  predniSONE (DELTASONE) 20 MG tablet Take 2 tablets (40 mg total) by mouth daily. 10/22/19   Rise Mu, PA-C  primidone (MYSOLINE) 250 MG tablet Take by mouth. 03/19/19   [provider]  spironolactone (ALDACTONE) 25 MG tablet Take by mouth. 04/24/19   [provider]   [phone] [facility] Allergies    Penicillins and Adhesive  [  tape]  Review of Systems   Review of Systems  Physical Exam Updated Vital Signs BP 119/80 (BP Location: Left Arm)   Pulse 84   Temp 98 F (36.7 C) (Oral)   Resp 18   Ht 5\' 2"  (1.575 m)   Wt 61.2 kg   SpO2 96%   BMI 24.69 kg/m   Physical Exam Vitals and nursing note reviewed. Exam conducted with a chaperone present.  Constitutional:      General: She is not in acute distress.    Appearance: Normal appearance. She is not ill-appearing, toxic-appearing or diaphoretic.  Cardiovascular:     Rate and Rhythm: Normal  rate and regular rhythm.     Pulses: Normal pulses.  Pulmonary:     Effort: Pulmonary effort is normal.  Abdominal:     General: Abdomen is flat.     Palpations: Abdomen is soft.     Tenderness: There is no abdominal tenderness.  Genitourinary:    General: Normal vulva.     Rectum: Normal.     Comments: Negative for thrombosed hemorrhoids or blood. Internal and external hemorrhoids present Skin:    General: Skin is warm and dry.     Capillary Refill: Capillary refill takes less than 2 seconds.  Neurological:     General: No focal deficit present.     Mental Status: She is alert.    ED Results / Procedures / Treatments   Labs (all labs ordered are listed, but only abnormal results are displayed) Labs Reviewed  CBC - Abnormal; Notable for the following components:      Result Value   RBC 3.84 (*)    MCV 100.5 (*)    Platelets 129 (*)    All other components within normal limits  OCCULT BLOOD X 1 CARD TO LAB, STOOL - Abnormal; Notable for the following components:   Fecal Occult Bld POSITIVE (*)    All other components within normal limits  COMPREHENSIVE METABOLIC PANEL   EKG None  Radiology No results found.  Procedures Procedures (including critical care time)  Medications Ordered in ED Medications - No data to display  ED Course  I have reviewed the triage vital signs and the nursing notes.  Pertinent labs & imaging results that were available during my care of the patient were reviewed by me and considered in my medical decision making (see chart for details).    MDM Rules/Calculators/A&P                          2 episodes of painless bright red bleeding per rectum and patient who is on Eliquis and has a history of hemorrhoids after an episode of straining while having a bowel movement.  Exam was negative for any obvious source of bleeding but positive for internal and external hemorrhoids.  Vital signs are stable on presentation and patient is stable.  She  is complaining of fatigue and anxiety. Unlikely that this is upper GI bleed given BRB, no abdominal pain or h/o NSAID use.  More likely, this is from a bleeding hemorrhoid or diverticular bleed which has currently resolved.  Assessment for anemia revealed normal hemoglobin of 13 with thrombocytopenia of 129. Admitting to follow hgb and monitor for rebleed.   Patient was supposed to be seen by cardiology today. Called Dr. and confirmed her medication list. Administering all with exception of eliquis and lasix.  Final Clinical Impression(s) / ED Diagnoses Final diagnoses:  None  Rx / DC Orders ED Discharge Orders    None       Leeroy Bock, DO 01/30/20 0258    Melene Plan, DO 01/30/20 0908    Dareen Piano, Carine Nordgren L, DO 01/30/20 0940    Jamelle Rushing L, DO 01/30/20 5277    Melene Plan, DO 01/30/20 1049    Melene Plan, DO 01/30/20 1309

## 2020-01-30 NOTE — ED Provider Notes (Signed)
Note entered in error   Leeroy Bock, DO 02/04/20 1426    Melene Plan, DO 02/07/20 [phone]

## 2020-01-31 DIAGNOSIS — E039 Hypothyroidism, unspecified: Secondary | ICD-10-CM

## 2020-01-31 DIAGNOSIS — I480 Paroxysmal atrial fibrillation: Secondary | ICD-10-CM

## 2020-01-31 DIAGNOSIS — E785 Hyperlipidemia, unspecified: Secondary | ICD-10-CM

## 2020-01-31 DIAGNOSIS — I5022 Chronic systolic (congestive) heart failure: Secondary | ICD-10-CM

## 2020-01-31 DIAGNOSIS — I10 Essential (primary) hypertension: Secondary | ICD-10-CM

## 2020-01-31 DIAGNOSIS — K625 Hemorrhage of anus and rectum: Secondary | ICD-10-CM | POA: Diagnosis not present

## 2020-01-31 LAB — CBC
HCT: 37.6 % (ref 36.0–46.0)
Hemoglobin: 12.6 g/dL (ref 12.0–15.0)
MCH: 34 pg (ref 26.0–34.0)
MCHC: 33.5 g/dL (ref 30.0–36.0)
MCV: 101.3 fL — ABNORMAL HIGH (ref 80.0–100.0)
Platelets: 110 10*3/uL — ABNORMAL LOW (ref 150–400)
RBC: 3.71 MIL/uL — ABNORMAL LOW (ref 3.87–5.11)
RDW: 15.6 % — ABNORMAL HIGH (ref 11.5–15.5)
WBC: 5.6 10*3/uL (ref 4.0–10.5)
nRBC: 0 % (ref 0.0–0.2)

## 2020-01-31 LAB — BASIC METABOLIC PANEL
Anion gap: 9 (ref 5–15)
BUN: 12 mg/dL (ref 8–23)
CO2: 23 mmol/L (ref 22–32)
Calcium: 8.7 mg/dL — ABNORMAL LOW (ref 8.9–10.3)
Chloride: 105 mmol/L (ref 98–111)
Creatinine, Ser: 0.96 mg/dL (ref 0.44–1.00)
GFR calc Af Amer: >60 mL/min (ref >60)
GFR calc non Af Amer: 53 mL/min — ABNORMAL LOW (ref >60)
Glucose, Bld: 86 mg/dL (ref 70–99)
Potassium: 3.6 mmol/L (ref 3.5–5.1)
Sodium: 137 mmol/L (ref 135–145)

## 2020-01-31 MED ORDER — ACETAMINOPHEN 325 MG PO TABS: 650.0000 mg | ORAL_TABLET | Freq: Four times a day (QID) | ORAL | Status: DC | PRN

## 2020-01-31 MED ORDER — ONDANSETRON HCL 4 MG PO TABS: 4.0000 mg | ORAL_TABLET | Freq: Four times a day (QID) | ORAL | Status: DC | PRN

## 2020-01-31 MED ORDER — PEG 3350-KCL-NA BICARB-NACL 420 G PO SOLR
4000.0000 mL | Freq: Once | ORAL | Status: AC
  Administered 2020-01-31: 4000 mL via ORAL

## 2020-01-31 MED ORDER — DILTIAZEM HCL 90 MG PO TABS: 300.0000 mg | ORAL_TABLET | Freq: Every day | ORAL | Status: DC

## 2020-01-31 MED ORDER — DILTIAZEM HCL ER COATED BEADS 180 MG PO CP24
300.0000 mg | ORAL_CAPSULE | Freq: Every day | ORAL | Status: DC
  Administered 2020-02-01: 300 mg via ORAL
  Filled 2020-01-31: qty 1

## 2020-01-31 MED ORDER — LOSARTAN POTASSIUM 25 MG PO TABS
25.0000 mg | ORAL_TABLET | Freq: Every day | ORAL | Status: DC
  Administered 2020-02-01: 25 mg via ORAL
  Filled 2020-01-31: qty 1

## 2020-01-31 MED ORDER — ONDANSETRON HCL 4 MG/2ML IJ SOLN
4.0000 mg | Freq: Four times a day (QID) | INTRAMUSCULAR | Status: DC | PRN
  Administered 2020-01-31: 4 mg via INTRAVENOUS
  Filled 2020-01-31: qty 2

## 2020-01-31 MED ORDER — ATORVASTATIN CALCIUM 20 MG PO TABS
20.0000 mg | ORAL_TABLET | Freq: Every day | ORAL | Status: DC
  Administered 2020-02-01: 20 mg via ORAL
  Filled 2020-01-31: qty 1

## 2020-01-31 MED ORDER — SODIUM CHLORIDE 0.9% FLUSH
3.0000 mL | Freq: Two times a day (BID) | INTRAVENOUS | Status: DC
  Administered 2020-01-31 – 2020-02-01 (×3): 3 mL via INTRAVENOUS

## 2020-01-31 MED ORDER — METOPROLOL SUCCINATE ER 50 MG PO TB24
150.0000 mg | ORAL_TABLET | Freq: Every day | ORAL | Status: DC
  Administered 2020-02-01: 150 mg via ORAL
  Filled 2020-01-31: qty 1

## 2020-01-31 MED ORDER — ACETAMINOPHEN 650 MG RE SUPP: 650.0000 mg | Freq: Four times a day (QID) | RECTAL | Status: DC | PRN

## 2020-01-31 NOTE — Plan of Care (Signed)

## 2020-01-31 NOTE — Progress Notes (Signed)
PROGRESS NOTE    Katherine Ewing  KYH:[id number] DOB: [birth year] DOA: 01/30/2020 PCP: Patient, No Pcp Per    Brief Narrative:  Katherine Ewing is a 86 y.o. female with medical history significant for PAF on Eliquis, MVP, chronic systolic CHF (last EF 45-50% by TTE 05/28/2019), HTN, HLD, hypothyroidism, OA, thrombocytopenia, and hemorrhoids who presents to the ED for evaluation of rectal bleeding.  Patient initially presented to [facility] ED morning of 01/30/2020 with complaint of large volume of bright red blood per rectum beginning the morning prior occurring after a bowel movement.  The bleeding resolved on its own but when she woke up morning of 01/30/2020 she had bright red blood on her bed sheets.  She did not see any dark black color stool.  She denied any nausea, vomiting, abdominal pain.  She felt generally weak but denied any lightheadedness, dizziness, or loss of consciousness.  She otherwise denies any chest pain, dyspnea, cough, dysuria.  She has not had any further bleeding since she presented to the ED.  She denies any NSAID use.  She does take Eliquis due to history of A. fib and DVT/PE.  ED Course:  Initial vitals showed BP 122/84, pulse 77, RR 18, temp 98.0 Fahrenheit, SPO2 93% on room air.  Labs showed hemoglobin 13.0, WBC 5.3, platelets 129,000, sodium 137, potassium 4.0, bicarb 24, BUN 14, creatinine 1.11, serum glucose 96, LFTs within normal limits.  FOBT was positive.  SARS-CoV-2 PCR was negative.  EDP discussed with on-call GI who will follow.  The hospitalist service was consulted to admit for further evaluation and management.   Assessment & Plan:   Principal Problem:   BRBPR (bright red blood per rectum) Active Problems:   Chronic systolic CHF (congestive heart failure) (HCC)   Essential hypertension   Hyperlipidemia LDL goal <160   Hypothyroidism   Paroxysmal atrial fibrillation (HCC)   1. GI bleeding.  Suspect lower GI bleed from diverticular source.  Anticoagulation  has been held.  Seen by GI with plans for colonoscopy in a.m.  Hemoglobin is currently stable. 2. Chronic atrial fibrillation.  CHA2DS2-VASc score of 7.  She is chronically on Eliquis.  This was held in light of bleeding.  Continue on amiodarone, Toprol and diltiazem.  Heart rate is currently stable. 3. Hypertension.  Blood pressure stable on Toprol, diltiazem and losartan. 4. Hypothyroidism.  Continue on Synthroid 5. Hyperlipidemia.  Continue statin 6. Essential tremor.  Continue primidone.   DVT prophylaxis: SCDs Start: 01/31/20 0109  Code Status: Full code Family Communication: Discussed with husband at the bedside Disposition Plan: Status is: Observation  The patient remains OBS appropriate and will d/c before 2 midnights.  Dispo: The patient is from: Home              Anticipated d/c is to: Home              Anticipated d/c date is: 1 day              Patient currently is not medically stable to d/c.   Consultants:   Gastroenterology  Procedures:     Antimicrobials:       Subjective: Patient denies any abdominal pain, nausea or vomiting.  She is not had a bowel movement today.  Objective: Vitals:   01/30/20 2300 01/31/20 0025 01/31/20 0946 01/31/20 1515  BP: 135/68 (!) 152/91 127/75 109/78  Pulse: 68 95 78 86  Resp: 16 18 20 20   Temp: 98 F (36.7 C) 98.4 F (36.9 C)  98.2 F (36.8 C) (!) 97.5 F (36.4 C)  TempSrc: Oral Oral Oral Oral  SpO2: 95% 99% 96% 97%  Weight:  61.2 kg    Height:  5\' 2"  (1.575 m)      Intake/Output Summary (Last 24 hours) at 01/31/2020 1801 Last data filed at 01/31/2020 1700 Gross per 24 hour  Intake 480 ml  Output 400 ml  Net 80 ml   Filed Weights   01/30/20 0703 01/31/20 0025  Weight: 61.2 kg 61.2 kg    Examination:  General exam: Appears calm and comfortable  Respiratory system: Clear to auscultation. Respiratory effort normal. Cardiovascular system: S1 & S2 heard, RRR. No JVD, murmurs, rubs, gallops or clicks. No pedal  edema. Gastrointestinal system: Abdomen is nondistended, soft and nontender. No organomegaly or masses felt. Normal bowel sounds heard. Central nervous system: Alert and oriented. No focal neurological deficits. Extremities: Symmetric 5 x 5 power. Skin: No rashes, lesions or ulcers Psychiatry: Judgement and insight appear normal. Mood & affect appropriate.     Data Reviewed: I have personally reviewed following labs and imaging studies  CBC: Recent Labs  Lab 01/30/20 0734 01/31/20 0422  WBC 5.3 5.6  HGB 13.0 12.6  HCT 38.6 37.6  MCV 100.5* 101.3*  PLT 129* 110*   Basic Metabolic Panel: Recent Labs  Lab 01/30/20 0734 01/31/20 0422  NA 137 137  K 4.0 3.6  CL 102 105  CO2 24 23  GLUCOSE 96 86  BUN 14 12  CREATININE 1.11* 0.96  CALCIUM 8.9 8.7*   GFR: Estimated Creatinine Clearance: 35.5 mL/min (by C-G formula based on SCr of 0.96 mg/dL). Liver Function Tests: Recent Labs  Lab 01/30/20 0734  AST 21  ALT 19  ALKPHOS 48  BILITOT 0.4  PROT 6.8  ALBUMIN 3.7   No results for input(s): LIPASE, AMYLASE in the last 168 hours. No results for input(s): AMMONIA in the last 168 hours. Coagulation Profile: No results for input(s): INR, PROTIME in the last 168 hours. Cardiac Enzymes: No results for input(s): CKTOTAL, CKMB, CKMBINDEX, TROPONINI in the last 168 hours. BNP (last 3 results) No results for input(s): PROBNP in the last 8760 hours. HbA1C: No results for input(s): HGBA1C in the last 72 hours. CBG: No results for input(s): GLUCAP in the last 168 hours. Lipid Profile: No results for input(s): CHOL, HDL, LDLCALC, TRIG, CHOLHDL, LDLDIRECT in the last 72 hours. Thyroid Function Tests: No results for input(s): TSH, T4TOTAL, FREET4, T3FREE, THYROIDAB in the last 72 hours. Anemia Panel: No results for input(s): VITAMINB12, FOLATE, FERRITIN, TIBC, IRON, RETICCTPCT in the last 72 hours. Sepsis Labs: No results for input(s): PROCALCITON, LATICACIDVEN in the last 168  hours.  Recent Results (from the past 240 hour(s))  SARS Coronavirus 2 by RT PCR (hospital order, performed in [facility] hospital lab) Nasopharyngeal Nasopharyngeal Swab     Status: None   Collection Time: 01/30/20 10:26 AM   Specimen: Nasopharyngeal Swab  Result Value Ref Range Status   SARS Coronavirus 2 NEGATIVE NEGATIVE Final    Comment: (NOTE) SARS-CoV-2 target nucleic acids are NOT DETECTED.  The SARS-CoV-2 RNA is generally detectable in upper and lower respiratory specimens during the acute phase of infection. The lowest concentration of SARS-CoV-2 viral copies this assay can detect is 250 copies / mL. A negative result does not preclude SARS-CoV-2 infection and should not be used as the sole basis for treatment or other patient management decisions.  A negative result may occur with improper specimen collection / handling,  submission of specimen other than nasopharyngeal swab, presence of viral mutation(s) within the areas targeted by this assay, and inadequate number of viral copies (<250 copies / mL). A negative result must be combined with clinical observations, patient history, and epidemiological information.  Fact Sheet for Patients:   BoilerBrush.com.cy  Fact Sheet for Healthcare Providers: https://pope.com/  This test is not yet approved or  cleared by the [place] FDA and has been authorized for detection and/or diagnosis of SARS-CoV-2 by FDA under an Emergency Use Authorization (EUA).  This EUA will remain in effect (meaning this test can be used) for the duration of the COVID-19 declaration under Section 564(b)(1) of the Act, 21 U.S.C. section 360bbb-3(b)(1), unless the authorization is terminated or revoked sooner.  Performed at [facility], [address], [place], [place] [address]          Radiology Studies: No results found.      Scheduled Meds: . amiodarone  200 mg Oral  Daily  . atorvastatin  20 mg Oral Daily  . diltiazem  300 mg Oral Daily  . levothyroxine  75 mcg Oral Q0600  . losartan  25 mg Oral Daily  . metoprolol succinate  150 mg Oral Daily  . pilocarpine  1 drop Both Eyes Once  . primidone  250 mg Oral QHS  . sodium chloride flush  3 mL Intravenous Q12H  . spironolactone  25 mg Oral Daily   Continuous Infusions:   LOS: 0 days    Time spent:    Erick Blinks, MD [facility]   If 7PM-7AM, please contact night-coverage [email or web address]  01/31/2020, 6:01 PM

## 2020-01-31 NOTE — Progress Notes (Signed)
Patient oriented to room and call bell, will be NPO but allowed ice chips and sips of water with meds, ambulatory with stand-by assistance to the bathroom, vss, a-fib on the monitor, awaiting GI consult in the morning, will continue to monitor.

## 2020-01-31 NOTE — Progress Notes (Signed)
Informed consent for colonoscopy signed by pt. Pt denied any questions or concerns.

## 2020-01-31 NOTE — Progress Notes (Signed)
Pt reported she had her Husband bring her morning meds from home and took them this am. Pt was advised by the night RN not to do so. Pt reported she took the meds anyway. Pt unable to list what meds were taken, but denies taking eliquis. Pt and Pt's Husband educated multiple times. Pt stated "you would do this too if you've been through what I've been through." Additional educated provided. Morning meds not given. Erick Blinks, MD paged.

## 2020-01-31 NOTE — H&P (Signed)
History and Physical    [name] XLK:[id number] DOB: [birth year] DOA: 01/30/2020  PCP: Patient, No Pcp Per  Patient coming from: [facility]  I have personally briefly reviewed patient's old medical records in [facility]  Chief Complaint: Rectal bleeding  HPI: Katherine Ewing is a 86 y.o. female with medical history significant for PAF on Eliquis, MVP, chronic systolic CHF (last EF 45-50% by TTE 05/28/2019), HTN, HLD, hypothyroidism, OA, thrombocytopenia, and hemorrhoids who presents to the ED for evaluation of rectal bleeding.  Patient initially presented to [facility] ED morning of 01/30/2020 with complaint of large volume of bright red blood per rectum beginning the morning prior occurring after a bowel movement.  The bleeding resolved on its own but when she woke up morning of 01/30/2020 she had bright red blood on her bed sheets.  She did not see any dark black color stool.  She denied any nausea, vomiting, abdominal pain.  She felt generally weak but denied any lightheadedness, dizziness, or loss of consciousness.  She otherwise denies any chest pain, dyspnea, cough, dysuria.  She has not had any further bleeding since she presented to the ED.  She denies any NSAID use.  She does take Eliquis due to history of A. fib and DVT/PE.  ED Course:  Initial vitals showed BP 122/84, pulse 77, RR 18, temp 98.0 Fahrenheit, SPO2 93% on room air.  Labs showed hemoglobin 13.0, WBC 5.3, platelets 129,000, sodium 137, potassium 4.0, bicarb 24, BUN 14, creatinine 1.11, serum glucose 96, LFTs within normal limits.  FOBT was positive.  SARS-CoV-2 PCR was negative.  EDP discussed with on-call GI who will follow.  The hospitalist service was consulted to admit for further evaluation and management.  Review of Systems: All systems reviewed and are negative except as documented in history of present illness above.   Past Medical History:  Diagnosis Date  . Hypertension   . MVP (mitral valve  prolapse)     Past Surgical History:  Procedure Laterality Date  . ABDOMINAL HYSTERECTOMY    . APPENDECTOMY    . CHOLECYSTECTOMY      Social History:  reports that she has never smoked. She has never used smokeless tobacco. She reports that she does not drink alcohol and does not use drugs.  Allergies  Allergen Reactions  . Penicillins Hives  . Adhesive  [Tape] Rash    History reviewed. No pertinent family history.   Prior to Admission medications   Medication Sig Start Date End Date Taking? Authorizing Provider  amiodarone (PACERONE) 200 MG tablet Take by mouth. 06/12/19   [provider]  apixaban (ELIQUIS) 5 MG TABS tablet Take by mouth. 06/12/19   [provider]  BENAZEPRIL-HYDROCHLOROTHIAZIDE PO Take by mouth.    [provider]  diltiazem (TIAZAC) 300 MG 24 hr capsule Take by mouth. 09/17/19   [provider]  dorzolamide-timolol (COSOPT) 22.3-6.8 MG/ML ophthalmic solution INSTILL 1 DROP INTO EACH EYE TWICE DAILY 12/18/18   [provider]  furosemide (LASIX) 40 MG tablet Take by mouth. 12/19/18   [provider]  HYDROcodone-acetaminophen (NORCO) 5-325 MG tablet Take 0.5 tablets by mouth every 4 (four) hours as needed for severe pain. 10/28/19   Molpus, John, MD  latanoprost (XALATAN) 0.005 % ophthalmic solution INSTILL 1 DROP INTO EACH EYE NIGHTLY 09/06/18   [provider]  levothyroxine (SYNTHROID) 50 MCG tablet Take by mouth. 07/16/19   [provider]  metoprolol succinate (TOPROL-XL) 100 MG 24 hr tablet  Take 1.5 tablets every day 06/12/19   [provider]  ondansetron (ZOFRAN) 8 MG tablet Take by mouth. 12/31/16   [provider]  pilocarpine (PILOCAR) 1 % ophthalmic solution Place 1 drop into the right eye 3 times daily. 08/23/19   [provider]  predniSONE (DELTASONE) 20 MG tablet Take 2 tablets (40 mg total) by mouth daily. 10/22/19   Rise Mu, PA-C  primidone  (MYSOLINE) 250 MG tablet Take by mouth. 03/19/19   [provider]  spironolactone (ALDACTONE) 25 MG tablet Take by mouth. 04/24/19   [provider]    Physical Exam: Vitals:   01/30/20 2200 01/30/20 2240 01/30/20 2300 01/31/20 0025  BP: (!) 122/56 (!) 122/56 135/68 (!) 152/91  Pulse: 74 81 68 95  Resp:   16 18  Temp:   98 F (36.7 C) 98.4 F (36.9 C)  TempSrc:   Oral Oral  SpO2: 94%  95% 99%  Weight:    61.2 kg  Height:    5\' 2"  (1.575 m)   Constitutional: Elderly woman resting supine in bed, NAD, calm, comfortable Eyes: PERRL, lids and conjunctivae normal ENMT: Mucous membranes are moist. Posterior pharynx clear of any exudate or lesions.Normal dentition.  Neck: normal, supple, no masses. Respiratory: clear to auscultation bilaterally, no wheezing, no crackles. Normal respiratory effort. No accessory muscle use.  Cardiovascular: Irregularly irregular, no murmurs / rubs / gallops. No extremity edema. 2+ pedal pulses. Abdomen: no tenderness, no masses palpated. No hepatosplenomegaly. Bowel sounds positive.  Musculoskeletal: no clubbing / cyanosis. No joint deformity upper and lower extremities. Good ROM, no contractures. Normal muscle tone.  Skin: Several bruises upper and lower extremities.  No ulcers.  No induration Neurologic: CN 2-12 grossly intact. Sensation intact, Strength 5/5 in all 4.  Essential tremor. Psychiatric: Normal judgment and insight. Alert and oriented x 3. Normal mood.   Labs on Admission: I have personally reviewed following labs and imaging studies  CBC: Recent Labs  Lab 01/30/20 0734  WBC 5.3  HGB 13.0  HCT 38.6  MCV 100.5*  PLT 129*   Basic Metabolic Panel: Recent Labs  Lab 01/30/20 0734  NA 137  K 4.0  CL 102  CO2 24  GLUCOSE 96  BUN 14  CREATININE 1.11*  CALCIUM 8.9   GFR: Estimated Creatinine Clearance: 30.7 mL/min (A) (by C-G formula based on SCr of 1.11 mg/dL (H)). Liver Function Tests: Recent Labs  Lab  01/30/20 0734  AST 21  ALT 19  ALKPHOS 48  BILITOT 0.4  PROT 6.8  ALBUMIN 3.7   No results for input(s): LIPASE, AMYLASE in the last 168 hours. No results for input(s): AMMONIA in the last 168 hours. Coagulation Profile: No results for input(s): INR, PROTIME in the last 168 hours. Cardiac Enzymes: No results for input(s): CKTOTAL, CKMB, CKMBINDEX, TROPONINI in the last 168 hours. BNP (last 3 results) No results for input(s): PROBNP in the last 8760 hours. HbA1C: No results for input(s): HGBA1C in the last 72 hours. CBG: No results for input(s): GLUCAP in the last 168 hours. Lipid Profile: No results for input(s): CHOL, HDL, LDLCALC, TRIG, CHOLHDL, LDLDIRECT in the last 72 hours. Thyroid Function Tests: No results for input(s): TSH, T4TOTAL, FREET4, T3FREE, THYROIDAB in the last 72 hours. Anemia Panel: No results for input(s): VITAMINB12, FOLATE, FERRITIN, TIBC, IRON, RETICCTPCT in the last 72 hours. Urine analysis: No results found for: COLORURINE, APPEARANCEUR, LABSPEC, PHURINE, GLUCOSEU, HGBUR, BILIRUBINUR, KETONESUR, PROTEINUR, UROBILINOGEN, NITRITE, LEUKOCYTESUR  Radiological Exams on  Admission: No results found.  EKG: Not performed.  Assessment/Plan Principal Problem:   BRBPR (bright red blood per rectum) Active Problems:   Chronic systolic CHF (congestive heart failure) (HCC)   Essential hypertension   Hyperlipidemia LDL goal <160   Hypothyroidism   Paroxysmal atrial fibrillation (HCC)  Byanca Deshler is a 86 y.o. female with medical history significant for PAF on Eliquis, MVP, chronic systolic CHF (last EF 45-50% by TTE 05/28/2019), HTN, HLD, hypothyroidism, OA, thrombocytopenia, and hemorrhoids who is admitted with BRBPR.  BRBPR/lower GI bleeding: Suspect hemorrhoidal versus diverticular bleeding.  Denies further bleeding since ED arrival. -Hold Eliquis -GI consulted -Keep n.p.o.  Atrial fibrillation: Remains in A. fib with controlled rate.  CHA2DS2-VASc  score is 7.  She has been on Eliquis anticoagulation. -Eliquis on hold due to GI bleed -Continue amiodarone, Toprol-XL, diltiazem  Chronic systolic CHF/tachycardia mediated cardiomyopathy: Last EF 45-50% by TTE 05/28/2019.  Appears compensated and euvolemic. -Continue Toprol-XL, diltiazem, losartan -Hold Lasix  HTN: -Continue Toprol-XL, diltiazem, losartan  Hypothyroidism: -Continue Synthroid  Hyperlipidemia: -Continue atorvastatin  Essential tremor: -Continue primidone  DVT prophylaxis: SCDs Code Status: Full code, confirmed with patient Family Communication: Discussed with patient, she has discussed with family Disposition Plan: From home and likely discharged home pending resolution of GI bleed, GI consultation Consults called: GI Admission status:  Status is: Observation  The patient remains OBS appropriate and will d/c before 2 midnights.  Dispo: The patient is from: Home              Anticipated d/c is to: Home              Anticipated d/c date is: 1 day              Patient currently is not medically stable to d/c.   Darreld Mclean MD [facility]  If 7PM-7AM, please contact night-coverage [email or web address]  01/31/2020, 1:22 AM

## 2020-01-31 NOTE — Progress Notes (Signed)
Pt's Husband came out into the hall and told the NT him and his Wife are going to leave since they haven't been seen by the doctor yet. Both were reassured that the doctor is rounding and will be in to see them shortly. Erick Blinks, MD notified.

## 2020-01-31 NOTE — Consult Note (Signed)
Reason for Consult: Hematochezia Referring Physician: [facility]  Katherine Ewing HPI: This is an 86 year old female with a PMH of PAF on Eliquis, CHF (EF 45-50%), HTN, hyperlipidemia, and hypothryroidism admitted for complaints of hematochezia.  She reports an episode of painless bleeding on 01/29/2020 with a bowel movement.  She woke up on 01/30/2020 with bright red blood in her bed.  There is no history of NSAID use of late.  Her HGB on admission was 13.0 g/dL and it is currently 17.4 g/dL.  Five years ago she recalls having a colonoscopy in [place] for a personal history of polyps.  At that time she was informed that she did not need any further routine colonoscopies.  Past Medical History:  Diagnosis Date  . Hypertension   . MVP (mitral valve prolapse)     Past Surgical History:  Procedure Laterality Date  . ABDOMINAL HYSTERECTOMY    . APPENDECTOMY    . CHOLECYSTECTOMY      History reviewed. No pertinent family history.  Social History:  reports that she has never smoked. She has never used smokeless tobacco. She reports that she does not drink alcohol and does not use drugs.  Allergies:  Allergies  Allergen Reactions  . Penicillins Hives  . Adhesive  [Tape] Rash    Medications:  Scheduled: . amiodarone  200 mg Oral Daily  . atorvastatin  20 mg Oral Daily  . diltiazem  300 mg Oral Daily  . levothyroxine  75 mcg Oral Q0600  . losartan  25 mg Oral Daily  . metoprolol succinate  150 mg Oral Daily  . pilocarpine  1 drop Both Eyes Once  . primidone  250 mg Oral QHS  . sodium chloride flush  3 mL Intravenous Q12H  . spironolactone  25 mg Oral Daily   Continuous:   Results for orders placed or performed during the hospital encounter of 01/30/20 (from the past 24 hour(s))  CBC     Status: Abnormal   Collection Time: 01/31/20  4:22 AM  Result Value Ref Range   WBC 5.6 4.0 - 10.5 K/uL   RBC 3.71 (L) 3.87 - 5.11 MIL/uL   Hemoglobin 12.6 12.0 - 15.0 g/dL   HCT 08.1 36  - 46 %   MCV 101.3 (H) 80.0 - 100.0 fL   MCH 34.0 26.0 - 34.0 pg   MCHC 33.5 30.0 - 36.0 g/dL   RDW 44.8 (H) 18.5 - 63.1 %   Platelets 110 (L) 150 - 400 K/uL   nRBC 0.0 0.0 - 0.2 %  Basic metabolic panel     Status: Abnormal   Collection Time: 01/31/20  4:22 AM  Result Value Ref Range   Sodium 137 135 - 145 mmol/L   Potassium 3.6 3.5 - 5.1 mmol/L   Chloride 105 98 - 111 mmol/L   CO2 23 22 - 32 mmol/L   Glucose, Bld 86 70 - 99 mg/dL   BUN 12 8 - 23 mg/dL   Creatinine, Ser 4.97 0.44 - 1.00 mg/dL   Calcium 8.7 (L) 8.9 - 10.3 mg/dL   GFR calc non Af Amer 53 (L) >60 mL/min   GFR calc Af Amer >60 >60 mL/min   Anion gap 9 5 - 15     No results found.  ROS:  As stated above in the HPI otherwise negative.  Blood pressure 127/75, pulse 78, temperature 98.2 F (36.8 C), temperature source Oral, resp. rate 20, height 5\' 2"  (1.575 m), weight 61.2 kg,  SpO2 96 %.    PE: Gen: NAD, Alert and Oriented HEENT:  [place]/AT, EOMI Neck: Supple, no LAD Lungs: CTA Bilaterally CV: RRR without M/G/R ABD: Soft, NTND, +BS Ext: No C/C/E  Assessment/Plan: 1) Hematochezia. 2) HTN. 3) MVP. 4) PAF.   The patient is hemodynamically stable.  Her clinical presentation is consistent with a diverticular bleed, but further evaluation with a colonoscopy will be performed.  Plan: 1) Colonoscopy tomorrow.     Marcelle Hepner D 01/31/2020, 12:30 PM

## 2020-01-31 NOTE — Progress Notes (Signed)
While giving bedside report in the next room pt is heard yelling "I can't get out of bed". Pts AM nurse,Shaina Shelton, and myself entered pts room. I introduced myself as the PM. I let the pt know she can use the bedside commode and the she should use the call bell for assistance for her safety. Pt also wanted water put in her cup. I asked the pt if she used assistive devices at home or had a caregiver. Pt immediately became offended, very irate and began yelling at me. Pt stated, "You are trying to be smart and degrade me. And I don't like how you are talking to me." I apologized to the pt and let her know these are questions that I ask all of my patients so that I know what they are able to do and can keep them safe. I told pt that I wanted to take good care of her and her safety was my number one priority. I filled her cups as requested and made sure her call bell was within reach. Pt was still very upset and began again saying that I was trying to degrade her and make her feel low. Marianna Fuss, RN then stepped in and let the pt know that my questions were common for nurses to ask. She reminded the pt that I had apologized and told the patient we would step out and let her calm down. Pierce Biagini,RN

## 2020-01-31 NOTE — Progress Notes (Signed)
Spoke with pt, pt's husband and bedside RN regarding importance of completing bowel prep prior to colonoscopy tomorrow 02/01/20. Pt and RN verbalized understanding. RN also made aware that pt is allowed to have morning meds with a small sip of water and that prep can be taken up to 2 hours prior to procedure. Weston Settle, RN

## 2020-02-01 ENCOUNTER — Observation Stay (HOSPITAL_COMMUNITY): Payer: Medicare HMO | Admitting: Certified Registered Nurse Anesthetist

## 2020-02-01 ENCOUNTER — Encounter (HOSPITAL_COMMUNITY): Payer: Self-pay | Admitting: Internal Medicine

## 2020-02-01 ENCOUNTER — Encounter (HOSPITAL_COMMUNITY)
Admission: EM | Disposition: A | Discharge status: Home / Self Care | Payer: Self-pay | Source: Home / Self Care | Attending: Emergency Medicine

## 2020-02-01 DIAGNOSIS — I110 Hypertensive heart disease with heart failure: Secondary | ICD-10-CM | POA: Diagnosis not present

## 2020-02-01 DIAGNOSIS — K5730 Diverticulosis of large intestine without perforation or abscess without bleeding: Secondary | ICD-10-CM | POA: Diagnosis not present

## 2020-02-01 DIAGNOSIS — Z20822 Contact with and (suspected) exposure to covid-19: Secondary | ICD-10-CM | POA: Diagnosis not present

## 2020-02-01 DIAGNOSIS — K625 Hemorrhage of anus and rectum: Secondary | ICD-10-CM | POA: Diagnosis not present

## 2020-02-01 DIAGNOSIS — K921 Melena: Secondary | ICD-10-CM | POA: Diagnosis not present

## 2020-02-01 HISTORY — PX: POLYPECTOMY: SHX5525

## 2020-02-01 HISTORY — PX: COLONOSCOPY WITH PROPOFOL: SHX5780

## 2020-02-01 LAB — CBC
HCT: 36 % (ref 36.0–46.0)
Hemoglobin: 12.4 g/dL (ref 12.0–15.0)
MCH: 34.1 pg — ABNORMAL HIGH (ref 26.0–34.0)
MCHC: 34.4 g/dL (ref 30.0–36.0)
MCV: 98.9 fL (ref 80.0–100.0)
Platelets: 94 10*3/uL — ABNORMAL LOW (ref 150–400)
RBC: 3.64 MIL/uL — ABNORMAL LOW (ref 3.87–5.11)
RDW: 15.3 % (ref 11.5–15.5)
WBC: 6.4 10*3/uL (ref 4.0–10.5)
nRBC: 0 % (ref 0.0–0.2)

## 2020-02-01 LAB — BASIC METABOLIC PANEL
Anion gap: 11 (ref 5–15)
BUN: 12 mg/dL (ref 8–23)
CO2: 22 mmol/L (ref 22–32)
Calcium: 8.4 mg/dL — ABNORMAL LOW (ref 8.9–10.3)
Chloride: 98 mmol/L (ref 98–111)
Creatinine, Ser: 0.88 mg/dL (ref 0.44–1.00)
GFR calc Af Amer: >60 mL/min (ref >60)
GFR calc non Af Amer: 59 mL/min — ABNORMAL LOW (ref >60)
Glucose, Bld: 75 mg/dL (ref 70–99)
Potassium: 3.6 mmol/L (ref 3.5–5.1)
Sodium: 131 mmol/L — ABNORMAL LOW (ref 135–145)

## 2020-02-01 SURGERY — COLONOSCOPY WITH PROPOFOL
Anesthesia: Monitor Anesthesia Care

## 2020-02-01 MED ORDER — HYDROCORTISONE ACE-PRAMOXINE 1-1 % EX CREA
TOPICAL_CREAM | Freq: Three times a day (TID) | CUTANEOUS | Status: DC
  Filled 2020-02-01: qty 30

## 2020-02-01 MED ORDER — LACTATED RINGERS IV SOLN: INTRAVENOUS | Status: DC

## 2020-02-01 MED ORDER — HYDROCORTISONE ACE-PRAMOXINE 1-1 % EX CREA: TOPICAL_CREAM | Freq: Three times a day (TID) | CUTANEOUS | 0 refills | Status: DC

## 2020-02-01 MED ORDER — SODIUM CHLORIDE 0.9 % IV SOLN: INTRAVENOUS | Status: DC

## 2020-02-01 MED ORDER — PROPOFOL 10 MG/ML IV BOLUS
INTRAVENOUS | Status: DC | PRN
  Administered 2020-02-01 (×2): 10 mg via INTRAVENOUS

## 2020-02-01 MED ORDER — LIDOCAINE HCL (CARDIAC) PF 100 MG/5ML IV SOSY
PREFILLED_SYRINGE | INTRAVENOUS | Status: DC | PRN
  Administered 2020-02-01: 40 mg via INTRAVENOUS

## 2020-02-01 MED ORDER — PROPOFOL 500 MG/50ML IV EMUL
INTRAVENOUS | Status: DC | PRN
  Administered 2020-02-01: 100 ug/kg/min via INTRAVENOUS

## 2020-02-01 SURGICAL SUPPLY — 21 items

## 2020-02-01 NOTE — Transfer of Care (Signed)
Immediate Anesthesia Transfer of Care Note  Patient: Katherine Ewing  Procedure(s) Performed: COLONOSCOPY WITH PROPOFOL (N/A ) POLYPECTOMY  Patient Location: Endoscopy Unit  Anesthesia Type:MAC  Level of Consciousness: drowsy and patient cooperative  Airway & Oxygen Therapy: Patient Spontanous Breathing and Patient connected to face mask oxygen  Post-op Assessment: Report given to RN and Post -op Vital signs reviewed and stable  Post vital signs: Reviewed and stable  Last Vitals:  Vitals Value Taken Time  BP    Temp    Pulse    Resp    SpO2      Last Pain:  Vitals:   02/01/20 1054  TempSrc: Oral  PainSc: 0-No pain         Complications: No complications documented.

## 2020-02-01 NOTE — Discharge Summary (Signed)
Physician Discharge Summary  Katherine Ewing VCB:[id number] DOB: 10-22-1930 DOA: 01/30/2020  PCP: Patient, No Pcp Per  Admit date: 01/30/2020 Discharge date: 02/01/2020  Admitted From: home Disposition:  Home  Recommendations for Outpatient Follow-up:  1. Follow up with PCP in 1-2 weeks 2. Please obtain BMP/CBC in one week 3. Please follow up on the following pending results:  Home Health:no  Equipment/Devices: none  Discharge Condition: Stable Code Status:   Code Status: Full Code Diet recommendation:  Diet Order            Diet regular Room service appropriate? Yes; Fluid consistency: Thin  Diet effective now           Diet - low sodium heart healthy                  Brief/Interim Summary: 86 y.o.femalewith medical history significant forPAF on Eliquis, MVP, chronic systolic CHF (last EF 45-50% by TTE 05/28/2019), HTN, HLD, hypothyroidism, OA, thrombocytopenia, and hemorrhoids who presents to the ED for evaluation of rectal bleeding.  Patient initially presented to [facility] ED morning of 01/30/2020 with complaint oflarge volume ofbright red blood per rectum beginning the morning prior occurring after a bowel movement. The bleeding resolved on its own but when she woke up morning of 9/1/2021she had bright red blood on her bed sheets.She did not see any dark black color stool. She denied any nausea, vomiting, abdominal pain. She felt generally weak but denied any lightheadedness, dizziness, or loss of consciousness. She otherwise denies any chest pain, dyspnea, cough, dysuria. She has not had any further bleeding since she presented to the ED. She denies any NSAID use. She does take Eliquis due to history of A. fib and DVT/PE.  ED Course: Initial vitals showed BP 122/84, pulse 77, RR 18, temp 98.0 Fahrenheit, SPO2 93% on room air.  Labs showed hemoglobin 13.0, WBC 5.3, platelets 129,000, sodium 137, potassium 4.0, bicarb 24, BUN 14, creatinine 1.11, serum glucose 96, LFTs  within normal limits. FOBT was positive. SARS-CoV-2 PCR was negative. EDP discussed with on-call GI who will follow  Patient was admitted seen by GI plan for colonoscopy 9/3 report "Scattered small-mouthed diverticula were found in the sigmoid colon. There was no evidence of any blood in the colon. Inspection of the hemorrhoids in the retroflexed view showed enlarged and erythematous hemorrhoids. It is likely that this is the source of her hematochezia as her HGB did not significantly drop.'A 2 mm polyp was found in the distal transverse colon. The polyp was sessile. The polyp was removed with a cold snare. Resection and retrieval were complete. GI advised resumption of diet, normal actvity tomorrow and follow up with [facility] gi office. Cont on hemorrhoidal cream. Patient can resume her Eliquis. She can  return back to be normal activity tomorrow.  Discharge Diagnoses:  Principal Problem:   BRBPR (bright red blood per rectum) Active Problems:   Chronic systolic CHF (congestive heart failure) (HCC)   Essential hypertension   Hyperlipidemia LDL goal <160   Hypothyroidism   Paroxysmal atrial fibrillation (HCC)  BRBPR/suspected hemorrhoidal GI bleeding. Eliquis was on hold, GI was consulted.Serial H&H remains stable.  S/P colonoscopy today- likely hemorrhoidal bleeding-spoke with Dr. Elnoria Howard okay to resume Eliquis and can discharge home on [facility] she can follow-up with your GI doctor cont on anusol cream.    Chronic systolic CHF: Compensated  Essential hypertension: Well controlled on diltiazem, losartan and metoprolol  Hyperlipidemia LDL goal <160: On Lipitor  Hypothyroidism: On Synthroid  Paroxysmal atrial fibrillation: Continue metoprolol/diltiazem, anticoagulation on hold for colonoscopy andas per Dr Elnoria Howard- she can resume at discharge   Consults:  GI  Subjective: No more Bleeding.  Underwent colonoscopy tolerating diet  Discharge Exam: Vitals:   02/01/20 1200 02/01/20 1317  BP:  (!) 119/59 128/71  Pulse: 77 (!) 102  Resp: 19 16  Temp:  97.7 F (36.5 C)  SpO2: 98% (!) 87%   General: Pt is alert, awake, not in acute distress Cardiovascular: RRR, S1/S2 +, no rubs, no gallops Respiratory: CTA bilaterally, no wheezing, no rhonchi Abdominal: Soft, NT, ND, bowel sounds + Extremities: no edema, no cyanosis  Discharge Instructions  Discharge Instructions    Diet - low sodium heart healthy   Complete by: As directed    Discharge instructions   Complete by: As directed    Follow up with your gastroenterology clinic at [facility] if not able to see you can follow-up with Dr. Elnoria Howard number provided.  Follow-up with your doctor primary care with CBC check in 1 week  Please call call MD or return to ER for similar or worsening recurring problem that brought you to hospital or if any fever,nausea/vomiting,abdominal pain, uncontrolled pain, chest pain,  shortness of breath or any other alarming symptoms.  Please follow-up your doctor as instructed in a week time and call the office for appointment.  Please avoid alcohol, smoking, or any other illicit substance and maintain healthy habits including taking your regular medications as prescribed.  You were cared for by a hospitalist during your hospital stay. If you have any questions about your discharge medications or the care you received while you were in the hospital after you are discharged, you can call the unit and ask to speak with the hospitalist on call if the hospitalist that took care of you is not available.  Once you are discharged, your primary care physician will handle any further medical issues. Please note that NO REFILLS for any discharge medications will be authorized once you are discharged, as it is imperative that you return to your primary care physician (or establish a relationship with a primary care physician if you do not have one) for your aftercare needs so that they can reassess your need for  medications and monitor your lab values   Increase activity slowly   Complete by: As directed      Allergies as of 02/01/2020      Reactions   Penicillins Hives   Did it involve swelling of the face/tongue/throat, SOB, or low BP? Y Did it involve sudden or severe rash/hives, skin peeling, or any reaction on the inside of your mouth or nose? Y Did you need to seek medical attention at a hospital or doctor's office? N When did it last happen?Decades Ago If all above answers are "NO", may proceed with cephalosporin use.   Adhesive  [tape] Rash      Medication List    STOP taking these medications   HYDROcodone-acetaminophen 5-325 MG tablet Commonly known as: Norco   predniSONE 20 MG tablet Commonly known as: DELTASONE     TAKE these medications   amiodarone 200 MG tablet Commonly known as: PACERONE Take 200 mg by mouth daily.   apixaban 5 MG Tabs tablet Commonly known as: ELIQUIS Take 5 mg by mouth 2 (two) times daily.   atorvastatin 20 MG tablet Commonly known as: LIPITOR Take 20 mg by mouth daily.   diltiazem 300 MG 24 hr capsule Commonly known as: CARDIZEM  CD Take 300 mg by mouth daily.   dorzolamide-timolol 22.3-6.8 MG/ML ophthalmic solution Commonly known as: COSOPT Place 1 drop into both eyes 2 (two) times daily.   furosemide 40 MG tablet Commonly known as: LASIX Take 40 mg by mouth every other day.   latanoprost 0.005 % ophthalmic solution Commonly known as: XALATAN Place 1 drop into both eyes at bedtime.   losartan 25 MG tablet Commonly known as: COZAAR Take 25 mg by mouth daily.   metoprolol succinate 100 MG 24 hr tablet Commonly known as: TOPROL-XL Take 150 mg by mouth daily.   pramoxine-hydrocortisone 1-1 % rectal cream Commonly known as: PROCTOCREAM-HC Place rectally 3 (three) times daily. Start taking on: February 02, 2020   primidone 250 MG tablet Commonly known as: MYSOLINE Take 250 mg by mouth daily.   spironolactone 25 MG  tablet Commonly known as: ALDACTONE Take 25 mg by mouth daily.       Follow-up Information    Jeani Hawking, MD. Call in 1 week(s).   Specialty: Gastroenterology Contact information: [address], [address] [place] [address] [phone]        PCP Follow up.   Contact information: Dr Edward Jolly              Allergies  Allergen Reactions  . Penicillins Hives    Did it involve swelling of the face/tongue/throat, SOB, or low BP? Y Did it involve sudden or severe rash/hives, skin peeling, or any reaction on the inside of your mouth or nose? Y Did you need to seek medical attention at a hospital or doctor's office? N When did it last happen?Decades Ago If all above answers are "NO", may proceed with cephalosporin use.    . Adhesive  [Tape] Rash    The results of significant diagnostics from this hospitalization (including imaging, microbiology, ancillary and laboratory) are listed below for reference.    Microbiology: Recent Results (from the past 240 hour(s))  SARS Coronavirus 2 by RT PCR (hospital order, performed in [facility] hospital lab) Nasopharyngeal Nasopharyngeal Swab     Status: None   Collection Time: 01/30/20 10:26 AM   Specimen: Nasopharyngeal Swab  Result Value Ref Range Status   SARS Coronavirus 2 NEGATIVE NEGATIVE Final    Comment: (NOTE) SARS-CoV-2 target nucleic acids are NOT DETECTED.  The SARS-CoV-2 RNA is generally detectable in upper and lower respiratory specimens during the acute phase of infection. The lowest concentration of SARS-CoV-2 viral copies this assay can detect is 250 copies / mL. A negative result does not preclude SARS-CoV-2 infection and should not be used as the sole basis for treatment or other patient management decisions.  A negative result may occur with improper specimen collection / handling, submission of specimen other than nasopharyngeal swab, presence of viral mutation(s) within the areas targeted by  this assay, and inadequate number of viral copies (<250 copies / mL). A negative result must be combined with clinical observations, patient history, and epidemiological information.  Fact Sheet for Patients:   BoilerBrush.com.cy  Fact Sheet for Healthcare Providers: https://pope.com/  This test is not yet approved or  cleared by the [place] FDA and has been authorized for detection and/or diagnosis of SARS-CoV-2 by FDA under an Emergency Use Authorization (EUA).  This EUA will remain in effect (meaning this test can be used) for the duration of the COVID-19 declaration under Section 564(b)(1) of the Act, 21 U.S.C. section 360bbb-3(b)(1), unless the authorization is terminated or revoked sooner.  Performed at [facility]  [address], [address] [place], [place] [address]     Procedures/Studies: No results found.  Labs: BNP (last 3 results) No results for input(s): BNP in the last 8760 hours. Basic Metabolic Panel: Recent Labs  Lab 01/30/20 0734 01/31/20 0422 02/01/20 0418  NA 137 137 131*  K 4.0 3.6 3.6  CL 102 105 98  CO2 24 23 22   GLUCOSE 96 86 75  BUN 14 12 12   CREATININE 1.11* 0.96 0.88  CALCIUM 8.9 8.7* 8.4*   Liver Function Tests: Recent Labs  Lab 01/30/20 0734  AST 21  ALT 19  ALKPHOS 48  BILITOT 0.4  PROT 6.8  ALBUMIN 3.7   No results for input(s): LIPASE, AMYLASE in the last 168 hours. No results for input(s): AMMONIA in the last 168 hours. CBC: Recent Labs  Lab 01/30/20 0734 01/31/20 0422 02/01/20 0418  WBC 5.3 5.6 6.4  HGB 13.0 12.6 12.4  HCT 38.6 37.6 36.0  MCV 100.5* 101.3* 98.9  PLT 129* 110* 94*   Cardiac Enzymes: No results for input(s): CKTOTAL, CKMB, CKMBINDEX, TROPONINI in the last 168 hours. BNP: Invalid input(s): POCBNP CBG: No results for input(s): GLUCAP in the last 168 hours. D-Dimer No results for input(s): DDIMER in the last 72 hours. Hgb A1c No results for  input(s): HGBA1C in the last 72 hours. Lipid Profile No results for input(s): CHOL, HDL, LDLCALC, TRIG, CHOLHDL, LDLDIRECT in the last 72 hours. Thyroid function studies No results for input(s): TSH, T4TOTAL, T3FREE, THYROIDAB in the last 72 hours.  Invalid input(s): FREET3 Anemia work up No results for input(s): VITAMINB12, FOLATE, FERRITIN, TIBC, IRON, RETICCTPCT in the last 72 hours. Urinalysis No results found for: COLORURINE, APPEARANCEUR, LABSPEC, PHURINE, GLUCOSEU, HGBUR, BILIRUBINUR, KETONESUR, PROTEINUR, UROBILINOGEN, NITRITE, LEUKOCYTESUR Sepsis Labs Invalid input(s): PROCALCITONIN,  WBC,  LACTICIDVEN Microbiology Recent Results (from the past 240 hour(s))  SARS Coronavirus 2 by RT PCR (hospital order, performed in [facility] lab) Nasopharyngeal Nasopharyngeal Swab     Status: None   Collection Time: 01/30/20 10:26 AM   Specimen: Nasopharyngeal Swab  Result Value Ref Range Status   SARS Coronavirus 2 NEGATIVE NEGATIVE Final    Comment: (NOTE) SARS-CoV-2 target nucleic acids are NOT DETECTED.  The SARS-CoV-2 RNA is generally detectable in upper and lower respiratory specimens during the acute phase of infection. The lowest concentration of SARS-CoV-2 viral copies this assay can detect is 250 copies / mL. A negative result does not preclude SARS-CoV-2 infection and should not be used as the sole basis for treatment or other patient management decisions.  A negative result may occur with improper specimen collection / handling, submission of specimen other than nasopharyngeal swab, presence of viral mutation(s) within the areas targeted by this assay, and inadequate number of viral copies (<250 copies / mL). A negative result must be combined with clinical observations, patient history, and epidemiological information.  Fact Sheet for Patients:   BoilerBrush.com.cyhttps://www.fda.gov/media/136312/download  Fact Sheet for Healthcare  Providers: https://pope.com/https://www.fda.gov/media/136313/download  This test is not yet approved or  cleared by the [place] FDA and has been authorized for detection and/or diagnosis of SARS-CoV-2 by FDA under an Emergency Use Authorization (EUA).  This EUA will remain in effect (meaning this test can be used) for the duration of the COVID-19 declaration under Section 564(b)(1) of the Act, 21 U.S.C. section 360bbb-3(b)(1), unless the authorization is terminated or revoked sooner.  Performed at [facility] [place], [address] [place], [place] [address]  Time coordinating discharge: 25 minutes  SIGNED: Lanae Boast, MD  [facility] 02/01/2020, 1:53 PM  If 7PM-7AM, please contact night-coverage [email or web address]

## 2020-02-01 NOTE — Anesthesia Preprocedure Evaluation (Addendum)
Anesthesia Evaluation  Patient identified by MRN, date of birth, ID band Patient awake    Reviewed: Allergy & Precautions, H&P , NPO status , Patient's Chart, lab work & pertinent test results, reviewed documented beta blocker date and time   Airway Mallampati: II  TM Distance: >3 FB Neck ROM: full    Dental no notable dental hx.    Pulmonary neg pulmonary ROS,    Pulmonary exam normal breath sounds clear to auscultation       Cardiovascular Exercise Tolerance: Good hypertension, Pt. on medications and Pt. on home beta blockers +CHF  + dysrhythmias Atrial Fibrillation  Rhythm:regular Rate:Normal  H/o chronic systolic CHF last EF 11-65% by TTE 05/28/2019    Neuro/Psych negative neurological ROS  negative psych ROS   GI/Hepatic negative GI ROS, Neg liver ROS,   Endo/Other  negative endocrine ROS  Renal/GU negative Renal ROS  negative genitourinary   Musculoskeletal  (+) Arthritis , Osteoarthritis,    Abdominal   Peds  Hematology negative hematology ROS (+) H/o thrombocytopenia    Anesthesia Other Findings   Reproductive/Obstetrics negative OB ROS                             Anesthesia Physical Anesthesia Plan  ASA: III  Anesthesia Plan: MAC   Post-op Pain Management:    Induction: Intravenous  PONV Risk Score and Plan: 2 and Treatment may vary due to age or medical condition and Propofol infusion  Airway Management Planned: Nasal Cannula and Simple Face Mask  Additional Equipment: None  Intra-op Plan:   Post-operative Plan:   Informed Consent: I have reviewed the patients History and Physical, chart, labs and discussed the procedure including the risks, benefits and alternatives for the proposed anesthesia with the patient or authorized representative who has indicated his/her understanding and acceptance.     Dental Advisory Given  Plan Discussed with: CRNA and  Anesthesiologist  Anesthesia Plan Comments:        Anesthesia Quick Evaluation

## 2020-02-01 NOTE — Op Note (Signed)
[facility] Patient Name: Katherine Ewing Procedure Date: 02/01/2020 MRN: [id number] Attending MD: Jeani Hawking , MD Date of Birth: June 10, 1930 CSN: [id number] Age: 86 Admit Type: Outpatient Procedure:                Colonoscopy Indications:              Hematochezia Providers:                Jeani Hawking, MD, Blenda Mounts, RN, Arlee Muslim                            Tech., Technician, Brion Aliment, Technician,                            Stephanie [place], CRNA Referring MD:              Medicines:                Propofol per Anesthesia Complications:            No immediate complications. Estimated Blood Loss:     Estimated blood loss: none. Procedure:                Pre-Anesthesia Assessment:                           - Prior to the procedure, a History and Physical                            was performed, and patient medications and                            allergies were reviewed. The patient's tolerance of                            previous anesthesia was also reviewed. The risks                            and benefits of the procedure and the sedation                            options and risks were discussed with the patient.                            All questions were answered, and informed consent                            was obtained. Prior Anticoagulants: The patient has                            taken no previous anticoagulant or antiplatelet                            agents. ASA Grade Assessment: III - A patient with                            severe systemic disease. After  reviewing the risks                            and benefits, the patient was deemed in                            satisfactory condition to undergo the procedure.                           - Sedation was administered by an anesthesia                            professional. Deep sedation was attained.                           After obtaining informed consent, the colonoscope                             was passed under direct vision. Throughout the                            procedure, the patient's blood pressure, pulse, and                            oxygen saturations were monitored continuously. The                            PCF-[id number] ([id number] Olympus pediatric colonscope                            was introduced through the anus and advanced to the                            the cecum, identified by appendiceal orifice and                            ileocecal valve. The colonoscopy was performed                            without difficulty. The patient tolerated the                            procedure well. The quality of the bowel                            preparation was good. The ileocecal valve,                            appendiceal orifice, and rectum were photographed. Scope In: 11:16:35 AM Scope Out: 11:36:30 AM Scope Withdrawal Time: 0 hours 12 minutes 8 seconds  Total Procedure Duration: 0 hours 19 minutes 55 seconds  Findings:      A 2 mm polyp was found in the distal transverse colon. The polyp was       sessile. The polyp was removed with a cold snare.  Resection and       retrieval were complete.      Scattered small-mouthed diverticula were found in the sigmoid colon.      There was no evidence of any blood in the colon. Inspection of the       hemorrhoids in the retroflexed view showed enlarged and erythematous       hemorrhoids. It is likely that this is the source of her hematochezia as       her HGB did not significantly drop. Impression:               - One 2 mm polyp in the distal transverse colon,                            removed with a cold snare. Resected and retrieved.                           - Diverticulosis in the sigmoid colon. Moderate Sedation:      Not Applicable - Patient had care per Anesthesia. Recommendation:           - Patient has a contact number available for                            emergencies. The  signs and symptoms of potential                            delayed complications were discussed with the                            patient. Return to normal activities tomorrow.                            Written discharge instructions were provided to the                            patient.                           - Resume previous diet.                           - Continue present medications.                           - Follow up with primary GI in [place].                           - Treat with a hemorrhoidal cream. Procedure Code(s):        --- Professional ---                           [phone], Colonoscopy, flexible; diagnostic, including                            collection of specimen(s) by brushing or washing,  when performed (separate procedure) Diagnosis Code(s):        --- Professional ---                           K92.1, Melena (includes Hematochezia)                           K57.30, Diverticulosis of large intestine without                            perforation or abscess without bleeding CPT copyright 2019 American Medical Association. All rights reserved. The codes documented in this report are preliminary and upon coder review may  be revised to meet current compliance requirements. Jeani Hawking, MD Jeani Hawking, MD 02/01/2020 11:46:13 AM This report has been signed electronically. Number of Addenda: 0

## 2020-02-03 ENCOUNTER — Encounter (HOSPITAL_COMMUNITY): Payer: Self-pay | Admitting: Gastroenterology

## 2020-02-05 LAB — SURGICAL PATHOLOGY

## 2020-02-05 NOTE — Anesthesia Postprocedure Evaluation (Signed)
Anesthesia Post Note  Patient: Katherine Ewing  Procedure(s) Performed: COLONOSCOPY WITH PROPOFOL (N/A ) POLYPECTOMY     Patient location during evaluation: PACU Anesthesia Type: MAC Level of consciousness: awake and alert Pain management: pain level controlled Vital Signs Assessment: post-procedure vital signs reviewed and stable Respiratory status: spontaneous breathing, nonlabored ventilation, respiratory function stable and patient connected to nasal cannula oxygen Cardiovascular status: stable and blood pressure returned to baseline Postop Assessment: no apparent nausea or vomiting Anesthetic complications: no   No complications documented.  Last Vitals:  Vitals:   02/01/20 1200 02/01/20 1317  BP: (!) 119/59 128/71  Pulse: 77 (!) 102  Resp: 19 16  Temp:  36.5 C  SpO2: 98% (!) 87%    Last Pain:  Vitals:   02/01/20 1317  TempSrc: Oral  PainSc:                  [name]

## 2020-06-22 ENCOUNTER — Other Ambulatory Visit: Payer: Self-pay

## 2020-06-22 ENCOUNTER — Emergency Department (HOSPITAL_COMMUNITY)
Admission: EM | Admit: 2020-06-22 | Discharge: 2020-06-22 | Disposition: A | Discharge status: Home / Self Care | Payer: Medicare HMO | Attending: Emergency Medicine | Admitting: Emergency Medicine

## 2020-06-22 ENCOUNTER — Emergency Department (HOSPITAL_COMMUNITY): Payer: Medicare HMO

## 2020-06-22 ENCOUNTER — Encounter (HOSPITAL_COMMUNITY): Payer: Self-pay | Admitting: Student

## 2020-06-22 DIAGNOSIS — I10 Essential (primary) hypertension: Secondary | ICD-10-CM

## 2020-06-22 DIAGNOSIS — I482 Chronic atrial fibrillation, unspecified: Secondary | ICD-10-CM

## 2020-06-22 DIAGNOSIS — Z79899 Other long term (current) drug therapy: Secondary | ICD-10-CM | POA: Diagnosis not present

## 2020-06-22 DIAGNOSIS — Z7901 Long term (current) use of anticoagulants: Secondary | ICD-10-CM | POA: Diagnosis not present

## 2020-06-22 DIAGNOSIS — I110 Hypertensive heart disease with heart failure: Secondary | ICD-10-CM | POA: Diagnosis not present

## 2020-06-22 DIAGNOSIS — R002 Palpitations: Secondary | ICD-10-CM | POA: Diagnosis present

## 2020-06-22 DIAGNOSIS — I480 Paroxysmal atrial fibrillation: Secondary | ICD-10-CM | POA: Insufficient documentation

## 2020-06-22 DIAGNOSIS — I4891 Unspecified atrial fibrillation: Secondary | ICD-10-CM

## 2020-06-22 DIAGNOSIS — I5022 Chronic systolic (congestive) heart failure: Secondary | ICD-10-CM | POA: Insufficient documentation

## 2020-06-22 DIAGNOSIS — I429 Cardiomyopathy, unspecified: Secondary | ICD-10-CM | POA: Diagnosis not present

## 2020-06-22 DIAGNOSIS — E039 Hypothyroidism, unspecified: Secondary | ICD-10-CM | POA: Insufficient documentation

## 2020-06-22 LAB — BASIC METABOLIC PANEL
Anion gap: 9 (ref 5–15)
BUN: 20 mg/dL (ref 8–23)
CO2: 22 mmol/L (ref 22–32)
Calcium: 8.9 mg/dL (ref 8.9–10.3)
Chloride: 108 mmol/L (ref 98–111)
Creatinine, Ser: 1.15 mg/dL — ABNORMAL HIGH (ref 0.44–1.00)
GFR, Estimated: 46 mL/min — ABNORMAL LOW (ref >60)
Glucose, Bld: 133 mg/dL — ABNORMAL HIGH (ref 70–99)
Potassium: 4.2 mmol/L (ref 3.5–5.1)
Sodium: 139 mmol/L (ref 135–145)

## 2020-06-22 LAB — CBC WITH DIFFERENTIAL/PLATELET
Abs Immature Granulocytes: 0.04 10*3/uL (ref 0.00–0.07)
Basophils Absolute: 0 10*3/uL (ref 0.0–0.1)
Basophils Relative: 0 %
Eosinophils Absolute: 0.1 10*3/uL (ref 0.0–0.5)
Eosinophils Relative: 1 %
HCT: 39.9 % (ref 36.0–46.0)
Hemoglobin: 12.7 g/dL (ref 12.0–15.0)
Immature Granulocytes: 1 %
Lymphocytes Relative: 12 %
Lymphs Abs: 0.8 10*3/uL (ref 0.7–4.0)
MCH: 32.3 pg (ref 26.0–34.0)
MCHC: 31.8 g/dL (ref 30.0–36.0)
MCV: 101.5 fL — ABNORMAL HIGH (ref 80.0–100.0)
Monocytes Absolute: 0.6 10*3/uL (ref 0.1–1.0)
Monocytes Relative: 9 %
Neutro Abs: 5.3 10*3/uL (ref 1.7–7.7)
Neutrophils Relative %: 77 %
Platelets: 95 10*3/uL — ABNORMAL LOW (ref 150–400)
RBC: 3.93 MIL/uL (ref 3.87–5.11)
RDW: 14 % (ref 11.5–15.5)
WBC: 6.9 10*3/uL (ref 4.0–10.5)
nRBC: 0 % (ref 0.0–0.2)

## 2020-06-22 LAB — TROPONIN I (HIGH SENSITIVITY)
Troponin I (High Sensitivity): 6 ng/L (ref <18)
Troponin I (High Sensitivity): 9 ng/L (ref <18)

## 2020-06-22 LAB — BRAIN NATRIURETIC PEPTIDE: B Natriuretic Peptide: 311 pg/mL — ABNORMAL HIGH (ref 0.0–100.0)

## 2020-06-22 MED ORDER — DILTIAZEM HCL ER COATED BEADS 300 MG PO CP24
300.0000 mg | ORAL_CAPSULE | Freq: Once | ORAL | Status: AC
  Administered 2020-06-22: 300 mg via ORAL
  Filled 2020-06-22: qty 1

## 2020-06-22 NOTE — ED Provider Notes (Signed)
87 yo female ho chronic afib, anticoagulated had "racing heart"  EMS found hypotensive with hr 170 and ?regular.  Patient took cardizem po and given fluids prehospital with unstable original rhythm on ekg. Physical Exam  BP 132/84   Pulse 100   Temp 97.6 F (36.4 C) (Oral)   Resp (!) 21   Ht 1.575 m (5\' 2" )   Wt 61 kg   SpO2 98%   BMI 24.60 kg/m   Physical Exam  ED Course/Procedures     Procedures  MDM  Cards to evaluate  Dr. saw and evaluated and  Reports he wanted patient to stay, but she is refusing admission. He has given her detailed instructions regarding treatment, outpatient follow up, and return precautions.     Izora Ribas, MD 06/22/20 [phone]

## 2020-06-22 NOTE — H&P (Addendum)
Cardiology H&P:   Patient ID: Katherine Ewing MRN: [id number]; DOB: 31-Oct-1930  Admit date: 06/22/2020 Date of Consult: 06/22/2020  Primary Care Provider: Randel Pigg, Dorma Russell, MD [facility] Cardiologist: [facility] (Dr. Sampson Goon) [facility] Electrophysiologist:  None    Patient Profile:   Katherine Ewing is a 87 y.o. female with a history of persistent atrial fibrillation/atrial flutter on Amiodarone and Eliquis, tachy-mediated cardiomyopathy with EF as low as 30-35% but improved to 45-50% on Echo in 05/2019, prior PE/DVT on chronic anticoagulation, GI bleed felt to be secondary to hemorrhoids, thrombocytopenia, hypertension, hypothyroidism, glaucoma, and macular degeneration who is being seen today for the evaluation of wide complex tachycardia and hypotension at the request of Dr. Blinda Leatherwood.  History of Present Illness:   Ms. Grams is a 87 year female with the above history who is followed by Dr. Sampson Goon at [facility] for her cardiac care. She has a history of persistent atrial fibrillation. She was previously on Amiodarone but this was stopped due to hypothyroidism. She has tachy-mediated cardiomyopathy with prior Echo in 07/2018 showing LVEF of 30-40% with mild pulmonary hypertension but no significant valvular disease. LVEF improved to 45-50% on repeat Echo in 05/2019. Patient states she has had a stress test and cardiac catheterization before with no evidence of any significant CAD. I cannot see any reports of this in Care Everywhere.  Patient was admitted at [facility] in 01/2020 with GI bleed. Colonoscopy showed scattered small-mouthed diverticula in the sigmoid colon without any evidence as well as enlarged and erythematous hemorrhoids which were felt to be the source of her hematochezia. GI was OK with patient continuing Eliquis.  Patient presented to the ED early this morning for further evaluation of palpitations.  She states she has been in her usual state of  health.  She drink Dr. Reino Kent yesterday afternoon around 3-4pm.  She was feeling fine yesterday afternoon/evening and then she woke up around 3 AM this morning with palpitations with associated mild chest heaviness and a little shortness of breath.  No lightheadedness, dizziness, near syncope/syncope.  Patient took a dose of her home Cardizem and 911 was called.  Upon EMS arrival she was noted to have a wide-complex tachycardia rate in the 160s and was hypotensive with BP of 70/40. She received 450 mL of normal saline with improvement of BP and patient converted back to atrial fibrillation prior to arrival to the ED. Chest heaviness and shortness of breath have resolved and she denies any chest discomfort or shortness of breath prior to this event.  No orthopnea, PND, lower extremity edema.  She states she has chronic congestion in her throat from her allergies but no recent fevers or illnesses.  She had some brief right-sided abdominal pain this morning but states that is resolved.  No nausea or vomiting.  No abnormal bleeding in urine or stools.  In the ED, vitals stable.  EKG shows atrial fibrillation, rate 109 bpm, with no acute ST/T changes. High-sensitivity troponin negative x2. BNP 311. Chest x-ray shows no acute findings. WBC 6.9, Hgb 12.7, Plts 95. Na 139, K 4.2, Glucose 133, BUN 20, Cr 1.15.   At the time of his evaluation, patient resting comfortably no acute distress.  She is asymptomatic at this time would like to go home.  Past Medical History:  Diagnosis Date  . Hypertension   . MVP (mitral valve prolapse)     Past Surgical History:  Procedure Laterality Date  . ABDOMINAL HYSTERECTOMY    .  APPENDECTOMY    . CHOLECYSTECTOMY    . COLONOSCOPY WITH PROPOFOL N/A 02/01/2020   Procedure: COLONOSCOPY WITH PROPOFOL;  Surgeon: Jeani HawkingHung, Patrick, MD;  Location: [facility];  Service: Endoscopy;  Laterality: N/A;  . POLYPECTOMY  02/01/2020   Procedure: POLYPECTOMY;  Surgeon: Jeani HawkingHung, Patrick, MD;   Location: [facility];  Service: Endoscopy;;     Home Medications:  Prior to Admission medications   Medication Sig Start Date End Date Taking? Authorizing Provider  apixaban (ELIQUIS) 5 MG TABS tablet Take 5 mg by mouth 2 (two) times daily.  06/12/19  Yes [provider]  atorvastatin (LIPITOR) 20 MG tablet Take 20 mg by mouth daily. 01/04/20  Yes [provider]  diltiazem (CARDIZEM CD) 300 MG 24 hr capsule Take 300 mg by mouth daily. 11/14/19  Yes [provider]  dorzolamide-timolol (COSOPT) 22.3-6.8 MG/ML ophthalmic solution Place 1 drop into both eyes 2 (two) times daily.  12/18/18  Yes [provider]  guaiFENesin (MUCINEX) 600 MG 12 hr tablet Take by mouth 2 (two) times daily as needed for cough or to loosen phlegm.   Yes [provider]  latanoprost (XALATAN) 0.005 % ophthalmic solution Place 1 drop into both eyes at bedtime.  09/06/18  Yes [provider]  losartan (COZAAR) 25 MG tablet Take 25 mg by mouth daily. 12/05/19  Yes [provider]  metoprolol succinate (TOPROL-XL) 100 MG 24 hr tablet Take 150 mg by mouth daily.  06/12/19  Yes [provider]  primidone (MYSOLINE) 250 MG tablet Take 250 mg by mouth 3 (three) times daily. 03/19/19  Yes [provider]  spironolactone (ALDACTONE) 25 MG tablet Take 25 mg by mouth daily.  04/24/19  Yes [provider]  traMADol (ULTRAM) 50 MG tablet Take 50 mg by mouth daily as needed for moderate pain. 06/18/20  Yes [provider]    Inpatient Medications: Scheduled Meds:  Continuous Infusions:  PRN Meds:   Allergies:    Allergies  Allergen Reactions  . Penicillins Hives    Did it involve swelling of the face/tongue/throat, SOB, or low BP? Y Did it involve sudden or severe rash/hives, skin peeling, or any reaction on the inside of your mouth or nose? Y Did you need to seek medical attention at a hospital or doctor's office? N When did it last  happen?Decades Ago If all above answers are "NO", may proceed with cephalosporin use.    . Pilocarpine Other (See Comments)    headache  . Adhesive  [Tape] Rash    Social History:   Social History   Socioeconomic History  . Marital status: Married    Spouse name: Not on file  . Number of children: Not on file  . Years of education: Not on file  . Highest education level: Not on file  Occupational History  . Not on file  Tobacco Use  . Smoking status: Never Smoker  . Smokeless tobacco: Never Used  Vaping Use  . Vaping Use: Never used  Substance and Sexual Activity  . Alcohol use: No  . Drug use: No  . Sexual activity: Not on file  Other Topics Concern  . Not on file  Social History Narrative  . Not on file   Social Determinants of Health   Financial Resource Strain: Not on file  Food Insecurity: Not on file  Transportation Needs: Not on file  Physical Activity: Not on file  Stress: Not on file  Social Connections: Not on file  Intimate  Partner Violence: Not on file    Family History:   Family History  Problem Relation Age of Onset  . Heart disease Mother        possible CAD     ROS:  Please see the history of present illness.  Review of Systems  Constitutional: Negative for fever.  HENT: Positive for congestion.   Respiratory: Positive for shortness of breath.   Cardiovascular: Positive for chest pain and palpitations. Negative for orthopnea, leg swelling and PND.  Gastrointestinal: Positive for abdominal pain. Negative for blood in stool, melena, nausea and vomiting.  Genitourinary: Negative for hematuria.  Musculoskeletal: Negative for falls and myalgias.  Neurological: Negative for dizziness and loss of consciousness.  Endo/Heme/Allergies: Does not bruise/bleed easily.  Psychiatric/Behavioral: Negative for substance abuse.    Physical Exam/Data:   Vitals:   06/22/20 0741 06/22/20 0806 06/22/20 0900 06/22/20 1030  BP: 132/84 123/74 130/75  130/87  Pulse:  97 91 96  Resp:  17 (!) 21 20  Temp:      TempSrc:      SpO2:  98% 97% 97%  Weight:      Height:       No intake or output data in the 24 hours ending 06/22/20 1104 Last 3 Weights 06/22/2020 02/01/2020 01/31/2020  Weight (lbs) 134 lb 7.7 oz 134 lb 14.7 oz 134 lb 14.7 oz  Weight (kg) 61 kg 61.2 kg 61.2 kg     Body mass index is 24.6 kg/m.  General: 87 y.o. female resting comfortably in no acute distress. HEENT: Normocephalic and atraumatic. Sclera clear.  Neck: Supple. No JVD. Heart: Mildly tachycardic with irregularly irregular rhythm. Distinct S1 and S2. No murmurs, gallops, or rubs.  Lungs: No increased work of breathing. Clear to ausculation bilaterally. No wheezes, rhonchi, or rales.  Abdomen: Soft, non-distended, and non-tender to palpation.  Extremities: No significant lower extremity edema (right leg slightly larger than left from prior DVT).    Skin: Warm and dry. Neuro: Alert and oriented x3. No focal deficits. Psych: Normal affect. Responds appropriately.  EKG:  The EKG was personally reviewed and demonstrates:  Atrial fibrillation, rate 109, with no acute ST/T changes.  Telemetry:  Telemetry was personally reviewed and demonstrates:  Atrial fibrillation with rates in the 90's to 110's.  Relevant CV Studies:  Echocardiogram 05/28/2019 [facility] Summary: Compared to prior study, changes are noted.  Non-specific mitral leaflet thickening with preserved mobility.  No evidence of mitral stenosis.  Mild mitral regurgitation.  Tricuspid valve is structurally normal.  No evidence of tricuspid stenosis.  Mild to moderate tricuspid regurgitation.  Estimated RV systolic pressure: 40 mmHg.  Left ventricle cavity size is normal.  Normal left ventricular wall thickness.  The left ventricular systolic function is mildly reduced.  Ejection fraction is visually estimated at 45-50%.  Diastolic function is indeterminate.  Difficult toassess wall motion due to  arrhythmia.  Mildly dilated right atrium.  Right atrial area of 18 cm squared.  Normal right ventricular size and function.  Right ventricular systolic pressure of 40 mmHg, consistent with mild pulmonary  hypertension.   Laboratory Data:  High Sensitivity Troponin:   Recent Labs  Lab 06/22/20 0534 06/22/20 0747  TROPONINIHS 6 9     Chemistry Recent Labs  Lab 06/22/20 0534  NA 139  K 4.2  CL 108  CO2 22  GLUCOSE 133*  BUN 20  CREATININE 1.15*  CALCIUM 8.9  GFRNONAA 46*  ANIONGAP 9    No results for input(s):  PROT, ALBUMIN, AST, ALT, ALKPHOS, BILITOT in the last 168 hours. Hematology Recent Labs  Lab 06/22/20 0534  WBC 6.9  RBC 3.93  HGB 12.7  HCT 39.9  MCV 101.5*  MCH 32.3  MCHC 31.8  RDW 14.0  PLT 95*   BNP Recent Labs  Lab 06/22/20 0535  BNP 311.0*    DDimer No results for input(s): DDIMER in the last 168 hours.   Radiology/Studies:  DG Chest Port 1 View  Result Date: 06/22/2020 CLINICAL DATA:  Palpitations and shortness of breath EXAM: PORTABLE CHEST 1 VIEW COMPARISON:  07/25/2017 FINDINGS: Chronic cardiomegaly. Prominent markings at the bases similar to prior. No edema, effusion, or pneumothorax. Artifact from EKG leads. IMPRESSION: No acute finding when compared to 2019. Electronically Signed   By: Marnee Spring M.D.   On: 06/22/2020 05:13     Assessment and Plan:   Possible SVT - EMS strip shows wide complex tachycardia with rates in the 160's - looks like possible SVT with aberrancy. She had associated chest heaviness and shortness of breath with this. Converted back to chronic atrial fibrillation with Cardizem and IV fluids and has had no recurrent chest discomfort or shortness of breath since then. - EKG in the ED shows atrial fibrillation with rate of 109 and no acute ST/T changes.  - High-sensitivity troponin negative x2.  - Will continue home Toprol-XL 150mg  daily.  Chronic Atrial Fibrillation - Rate currently in the 90's to low  100's. - Continue Toprol-XL 150mg  daily. - Continue home Eliquis 5mg  twice daily.  Tachymediated Cardiomyopathy - Most recent Echo in 05/2019 at [facility] showed LVEF of 45-50%.  - Appears euvolemic on exam. - Continue home medications: Losartan 25mg  daily, Toprol-XL 150mg  daily, and Spironolactone 25mg  daily. - Will continue to monitor volume status.   Hypertension - History of hypertension but initially hypotensive with episode of SVT above. Received 450 mL of normal saline by EMS and BP improved. Systolic BP ranging from 110's to 130's. - Continue above medications.   Prior PE/DVT - On chronic anticoagulation with Eliquis.   Risk Assessment/Risk Scores:   New York Heart Association (NYHA) Functional Class NYHA Class I  CHA2DS2-VASc Score = 8  This indicates a 10.8% annual risk of stroke. The patient's score is based upon: CHF History: Yes HTN History: Yes Diabetes History: No Stroke History: Yes (no stroke but PE/DVT) Vascular Disease History: Yes (atherosclerosis of thoracic aorta noted on chest CTA in 07/2018) Age Score: 2 Gender Score: 1  Signed,  06/2019, PA-C    06/22/2020 11:04 AM     For questions or updates, please contact [facility] Please consult [email or web address] for contact info under    Signed, , PA-C  06/22/2020 11:04 AM   Personally seen and examined. Agree with APP above with the following comments: Briefly 87 yo F with permanent atrial fibrillation compounded by tachy-meditated cardiomyopathy that has not been on amiodarone secondary to concern about side effects (cannot identify true AIT) with two prior failed DCCV, and with no desire to pursue ablation given the length of the procedure who presentes this AM with symptomatic a fib RVR.  Was found to be hypotensive with WCT (likely a fib with aberrancy) with a heart rate 167.  With IVF and patient taking her hope diltiazem, heart rates have improves < 110 bpm. Patient  notes that she takes the diltiazem every day and that it has saved her life.  Will never stop that medication as it is  the one medication that has helped her feel better.  No palpitations, SOB, Doe, CP or chest pressure pressently with heart rate 98. Exam notable for irregularlly irregular rhythm, CTAB, no LE edema (there is a yellow, hard discoloration on the right shin that patient notes is chronic), with no O2 requirement Labs notable for relatively normal Hs Troponin, BNP of 311. Would recommend observation, and echocardiogram with rediscussion of AAD therapy SHARED DECISION MAKING:  Discussed at length with the patient and her husband- she is not interested in trying AAD because of side effects risk; she has failed two prior.  Patient would not be amenable to procedural evaluation.  Denies classic HF symptoms.  Though this is not the most conservative plan, patient and husband are aware of the risks including death with this plan.  It would be reasonable to have her continue her home medications and follow up with her new cardiologist ([facility]) at their early February appointment.  Patient motes that if she has these symptoms again she is willing to stay to get a complete evaluation. Discussed with patient, husband, and primary MD  Christell Constant, MD

## 2020-06-22 NOTE — Discharge Instructions (Addendum)
Call your cardiologist tomorrow for recheck asap Return to the ED if you change your mind regarding admission or have worsening symptoms

## 2020-06-22 NOTE — ED Provider Notes (Signed)
[facility] EMERGENCY DEPARTMENT Provider Note   CSN: [id number] Arrival date & time: 06/22/20  0430     History No chief complaint on file.   Katherine Ewing is a 87 y.o. female.  Patient presents to the emergency department from home for heart palpitations.  Patient reports a history of chronic atrial fibrillation.  She normally goes to [facility] as that is where her cardiology is, but they are "on diversion".  Patient reports that she had sudden onset of feeling of rapid heartbeat.  She took an additional oral Cardizem tablet and called EMS.  EMS captured a slightly widened QRS tachycardia in the 160s.  Patient was identified to be hypotensive, blood pressure in the 70s.  Patient started on IV fluids and her blood pressure improved, patient then changed into a atrial fibrillation rhythm in the 110s, reporting significant improvement of her symptoms.        Past Medical History:  Diagnosis Date  . Hypertension   . MVP (mitral valve prolapse)     Patient Active Problem List   Diagnosis Date Noted  . BRBPR (bright red blood per rectum) 01/30/2020  . Hypothyroidism 10/18/2018  . Chronic systolic CHF (congestive heart failure) (HCC) 07/17/2018  . Essential hypertension 09/25/2013  . Hyperlipidemia LDL goal <160 09/25/2013  . Paroxysmal atrial fibrillation (HCC) 09/25/2013    Past Surgical History:  Procedure Laterality Date  . ABDOMINAL HYSTERECTOMY    . APPENDECTOMY    . CHOLECYSTECTOMY    . COLONOSCOPY WITH PROPOFOL N/A 02/01/2020   Procedure: COLONOSCOPY WITH PROPOFOL;  Surgeon: Jeani Hawking, MD;  Location: [facility];  Service: Endoscopy;  Laterality: N/A;  . POLYPECTOMY  02/01/2020   Procedure: POLYPECTOMY;  Surgeon: Jeani Hawking, MD;  Location: [facility];  Service: Endoscopy;;     OB History   No obstetric history on file.     No family history on file.  Social History   Tobacco Use  . Smoking status: Never Smoker  .  Smokeless tobacco: Never Used  Vaping Use  . Vaping Use: Never used  Substance Use Topics  . Alcohol use: No  . Drug use: No    Home Medications Prior to Admission medications   Medication Sig Start Date End Date Taking? Authorizing Provider  apixaban (ELIQUIS) 5 MG TABS tablet Take 5 mg by mouth 2 (two) times daily.  06/12/19  Yes [provider]  atorvastatin (LIPITOR) 20 MG tablet Take 20 mg by mouth daily. 01/04/20  Yes [provider]  diltiazem (CARDIZEM CD) 300 MG 24 hr capsule Take 300 mg by mouth daily. 11/14/19  Yes [provider]  dorzolamide-timolol (COSOPT) 22.3-6.8 MG/ML ophthalmic solution Place 1 drop into both eyes 2 (two) times daily.  12/18/18  Yes [provider]  guaiFENesin (MUCINEX) 600 MG 12 hr tablet Take by mouth 2 (two) times daily as needed for cough or to loosen phlegm.   Yes [provider]  latanoprost (XALATAN) 0.005 % ophthalmic solution Place 1 drop into both eyes at bedtime.  09/06/18  Yes [provider]  losartan (COZAAR) 25 MG tablet Take 25 mg by mouth daily. 12/05/19  Yes [provider]  metoprolol succinate (TOPROL-XL) 100 MG 24 hr tablet Take 150 mg by mouth daily.  06/12/19  Yes [provider]  primidone (MYSOLINE) 250 MG tablet Take 250 mg by mouth 3 (three) times daily. 03/19/19  Yes [provider]  spironolactone (ALDACTONE) 25 MG tablet Take 25 mg by  mouth daily.  04/24/19  Yes [provider]  traMADol (ULTRAM) 50 MG tablet Take 50 mg by mouth daily as needed for moderate pain. 06/18/20  Yes [provider]    Allergies    Penicillins, Pilocarpine, and Adhesive  [tape]  Review of Systems   Review of Systems  Cardiovascular: Positive for palpitations.  All other systems reviewed and are negative.   Physical Exam Updated Vital Signs BP 133/64   Pulse 68   Temp 97.6 F (36.4 C) (Oral)   Resp 18   Ht 5\' 2"  (1.575 m)   Wt 61 kg   SpO2 100%    BMI 24.60 kg/m   Physical Exam Vitals and nursing note reviewed.  Constitutional:      General: She is not in acute distress.    Appearance: Normal appearance. She is well-developed and well-nourished.  HENT:     Head: Normocephalic and atraumatic.     Right Ear: Hearing normal.     Left Ear: Hearing normal.     Nose: Nose normal.     Mouth/Throat:     Mouth: Oropharynx is clear and moist and mucous membranes are normal.  Eyes:     Extraocular Movements: EOM normal.     Conjunctiva/sclera: Conjunctivae normal.     Pupils: Pupils are equal, round, and reactive to light.  Cardiovascular:     Rate and Rhythm: Tachycardia present. Rhythm irregular.     Heart sounds: S1 normal and S2 normal. No murmur heard. No friction rub. No gallop.   Pulmonary:     Effort: Pulmonary effort is normal. No respiratory distress.     Breath sounds: Normal breath sounds.  Chest:     Chest wall: No tenderness.  Abdominal:     General: Bowel sounds are normal.     Palpations: Abdomen is soft. There is no hepatosplenomegaly.     Tenderness: There is no abdominal tenderness. There is no guarding or rebound. Negative signs include Murphy's sign and McBurney's sign.     Hernia: No hernia is present.  Musculoskeletal:        General: Normal range of motion.     Cervical back: Normal range of motion and neck supple.  Skin:    General: Skin is warm, dry and intact.     Findings: No rash.     Nails: There is no cyanosis.  Neurological:     Mental Status: She is alert and oriented to person, place, and time.     GCS: GCS eye subscore is 4. GCS verbal subscore is 5. GCS motor subscore is 6.     Cranial Nerves: No cranial nerve deficit.     Sensory: No sensory deficit.     Coordination: Coordination normal.     Deep Tendon Reflexes: Strength normal.  Psychiatric:        Mood and Affect: Mood and affect normal.        Speech: Speech normal.        Behavior: Behavior normal.        Thought Content:  Thought content normal.     ED Results / Procedures / Treatments   Labs (all labs ordered are listed, but only abnormal results are displayed) Labs Reviewed  CBC WITH DIFFERENTIAL/PLATELET - Abnormal; Notable for the following components:      Result Value   MCV 101.5 (*)    Platelets 95 (*)    All other components within normal limits  BASIC METABOLIC PANEL - Abnormal; Notable  for the following components:   Glucose, Bld 133 (*)    Creatinine, Ser 1.15 (*)    GFR, Estimated 46 (*)    All other components within normal limits  BRAIN NATRIURETIC PEPTIDE - Abnormal; Notable for the following components:   B Natriuretic Peptide 311.0 (*)    All other components within normal limits  TROPONIN I (HIGH SENSITIVITY)  TROPONIN I (HIGH SENSITIVITY)    EKG EKG Interpretation  Date/Time:  Sunday June 22 2020 04:41:20 EST Ventricular Rate:  109 PR Interval:    QRS Duration: 105 QT Interval:  348 QTC Calculation: 421 R Axis:   119 Text Interpretation: Atrial fibrillation Ventricular premature complex Right axis deviation Minimal ST depression, inferior leads Confirmed by Gilda Crease ([id number]) on 06/22/2020 4:43:39 AM  Prehospital EKG:       Radiology DG Chest Port 1 View  Result Date: 06/22/2020 CLINICAL DATA:  Palpitations and shortness of breath EXAM: PORTABLE CHEST 1 VIEW COMPARISON:  07/25/2017 FINDINGS: Chronic cardiomegaly. Prominent markings at the bases similar to prior. No edema, effusion, or pneumothorax. Artifact from EKG leads. IMPRESSION: No acute finding when compared to 2019. Electronically Signed   By: Marnee Spring M.D.   On: 06/22/2020 05:13    Procedures Procedures (including critical care time)  Medications Ordered in ED Medications  diltiazem (CARDIZEM CD) 24 hr capsule 300 mg (has no administration in time range)    ED Course  I have reviewed the triage vital signs and the nursing notes.  Pertinent labs & imaging results that were  available during my care of the patient were reviewed by me and considered in my medical decision making (see chart for details).    MDM Rules/Calculators/A&P                          Patient presented to the emergency department for evaluation of palpitations.  Patient does have history of chronic atrial fibrillation.  EMS documented a regular tachycardia with varying widening of the QRS prehospital.  Rhythm unclear, possible a flutter with aberrancy, SVT, V. tach.  Heart rate around 170.  Patient took Cardizem prior to calling EMS and did have a rhythm change back into atrial fibrillation around 100 bpm during transport, has maintained this rhythm.  Heart rate is slowly going up, however, will give morning dose of Cardizem.  Cardiology consultation to help further distinguish rhythms and determine treatment plan and disposition.  Final Clinical Impression(s) / ED Diagnoses Final diagnoses:  Atrial fibrillation with RVR [facility])    Rx / DC Orders ED Discharge Orders    None       Markavious Micco, Canary Brim, MD 06/22/20 [phone]

## 2020-06-22 NOTE — ED Triage Notes (Signed)
Pt brought to ED via EMS from home. Pt reports waking up around 3AM with increased HR. Hx of SVT, in SVT weith EMS on arrival to home with BP 70/40. Pt took home cardizem prior to EMS arrival, given 450 mL of NS. BP increased to 130/100, pt self converted to afib with HR 70-120 PTA. Alert and oriented on arrival to ED. 2L [place] for comfort by EMS.

## 2020-08-13 ENCOUNTER — Emergency Department (HOSPITAL_COMMUNITY): Payer: Medicare HMO

## 2020-08-13 ENCOUNTER — Inpatient Hospital Stay (HOSPITAL_COMMUNITY)
Admission: EM | Admit: 2020-08-13 | Discharge: 2020-08-29 | DRG: 870 | Disposition: E | Payer: Medicare HMO | Attending: Student | Admitting: Student

## 2020-08-13 ENCOUNTER — Other Ambulatory Visit: Payer: Self-pay

## 2020-08-13 ENCOUNTER — Encounter (HOSPITAL_COMMUNITY): Payer: Self-pay | Admitting: Pharmacy Technician

## 2020-08-13 DIAGNOSIS — I4891 Unspecified atrial fibrillation: Secondary | ICD-10-CM | POA: Diagnosis present

## 2020-08-13 DIAGNOSIS — Z79899 Other long term (current) drug therapy: Secondary | ICD-10-CM

## 2020-08-13 DIAGNOSIS — K5900 Constipation, unspecified: Secondary | ICD-10-CM | POA: Diagnosis not present

## 2020-08-13 DIAGNOSIS — I4892 Unspecified atrial flutter: Secondary | ICD-10-CM | POA: Diagnosis present

## 2020-08-13 DIAGNOSIS — G250 Essential tremor: Secondary | ICD-10-CM | POA: Diagnosis present

## 2020-08-13 DIAGNOSIS — J9621 Acute and chronic respiratory failure with hypoxia: Secondary | ICD-10-CM | POA: Diagnosis not present

## 2020-08-13 DIAGNOSIS — D696 Thrombocytopenia, unspecified: Secondary | ICD-10-CM | POA: Diagnosis present

## 2020-08-13 DIAGNOSIS — J9601 Acute respiratory failure with hypoxia: Secondary | ICD-10-CM | POA: Diagnosis present

## 2020-08-13 DIAGNOSIS — Z86711 Personal history of pulmonary embolism: Secondary | ICD-10-CM

## 2020-08-13 DIAGNOSIS — Z8249 Family history of ischemic heart disease and other diseases of the circulatory system: Secondary | ICD-10-CM | POA: Diagnosis not present

## 2020-08-13 DIAGNOSIS — F419 Anxiety disorder, unspecified: Secondary | ICD-10-CM | POA: Diagnosis present

## 2020-08-13 DIAGNOSIS — Z880 Allergy status to penicillin: Secondary | ICD-10-CM | POA: Diagnosis not present

## 2020-08-13 DIAGNOSIS — H9190 Unspecified hearing loss, unspecified ear: Secondary | ICD-10-CM | POA: Diagnosis present

## 2020-08-13 DIAGNOSIS — I4819 Other persistent atrial fibrillation: Secondary | ICD-10-CM | POA: Diagnosis present

## 2020-08-13 DIAGNOSIS — I9589 Other hypotension: Secondary | ICD-10-CM | POA: Diagnosis not present

## 2020-08-13 DIAGNOSIS — J690 Pneumonitis due to inhalation of food and vomit: Secondary | ICD-10-CM | POA: Diagnosis present

## 2020-08-13 DIAGNOSIS — Z9049 Acquired absence of other specified parts of digestive tract: Secondary | ICD-10-CM | POA: Diagnosis not present

## 2020-08-13 DIAGNOSIS — Z515 Encounter for palliative care: Secondary | ICD-10-CM | POA: Diagnosis not present

## 2020-08-13 DIAGNOSIS — I5043 Acute on chronic combined systolic (congestive) and diastolic (congestive) heart failure: Secondary | ICD-10-CM | POA: Diagnosis not present

## 2020-08-13 DIAGNOSIS — Z7901 Long term (current) use of anticoagulants: Secondary | ICD-10-CM

## 2020-08-13 DIAGNOSIS — A419 Sepsis, unspecified organism: Principal | ICD-10-CM

## 2020-08-13 DIAGNOSIS — R6521 Severe sepsis with septic shock: Secondary | ICD-10-CM | POA: Diagnosis present

## 2020-08-13 DIAGNOSIS — I5023 Acute on chronic systolic (congestive) heart failure: Secondary | ICD-10-CM | POA: Diagnosis present

## 2020-08-13 DIAGNOSIS — J189 Pneumonia, unspecified organism: Secondary | ICD-10-CM | POA: Diagnosis present

## 2020-08-13 DIAGNOSIS — R197 Diarrhea, unspecified: Secondary | ICD-10-CM | POA: Diagnosis not present

## 2020-08-13 DIAGNOSIS — Z20822 Contact with and (suspected) exposure to covid-19: Secondary | ICD-10-CM | POA: Diagnosis present

## 2020-08-13 DIAGNOSIS — D6489 Other specified anemias: Secondary | ICD-10-CM | POA: Diagnosis present

## 2020-08-13 DIAGNOSIS — R0600 Dyspnea, unspecified: Secondary | ICD-10-CM

## 2020-08-13 DIAGNOSIS — Z781 Physical restraint status: Secondary | ICD-10-CM

## 2020-08-13 DIAGNOSIS — I959 Hypotension, unspecified: Secondary | ICD-10-CM | POA: Diagnosis present

## 2020-08-13 DIAGNOSIS — J9690 Respiratory failure, unspecified, unspecified whether with hypoxia or hypercapnia: Secondary | ICD-10-CM

## 2020-08-13 DIAGNOSIS — G9341 Metabolic encephalopathy: Secondary | ICD-10-CM

## 2020-08-13 DIAGNOSIS — Z888 Allergy status to other drugs, medicaments and biological substances status: Secondary | ICD-10-CM

## 2020-08-13 DIAGNOSIS — E876 Hypokalemia: Secondary | ICD-10-CM | POA: Diagnosis not present

## 2020-08-13 DIAGNOSIS — I110 Hypertensive heart disease with heart failure: Secondary | ICD-10-CM | POA: Diagnosis present

## 2020-08-13 DIAGNOSIS — R1310 Dysphagia, unspecified: Secondary | ICD-10-CM | POA: Diagnosis present

## 2020-08-13 DIAGNOSIS — I5022 Chronic systolic (congestive) heart failure: Secondary | ICD-10-CM | POA: Diagnosis not present

## 2020-08-13 DIAGNOSIS — I341 Nonrheumatic mitral (valve) prolapse: Secondary | ICD-10-CM | POA: Diagnosis present

## 2020-08-13 DIAGNOSIS — Z91048 Other nonmedicinal substance allergy status: Secondary | ICD-10-CM

## 2020-08-13 DIAGNOSIS — J9622 Acute and chronic respiratory failure with hypercapnia: Secondary | ICD-10-CM | POA: Diagnosis not present

## 2020-08-13 DIAGNOSIS — I429 Cardiomyopathy, unspecified: Secondary | ICD-10-CM | POA: Diagnosis present

## 2020-08-13 DIAGNOSIS — Z66 Do not resuscitate: Secondary | ICD-10-CM

## 2020-08-13 DIAGNOSIS — Z90710 Acquired absence of both cervix and uterus: Secondary | ICD-10-CM | POA: Diagnosis not present

## 2020-08-13 DIAGNOSIS — Z86718 Personal history of other venous thrombosis and embolism: Secondary | ICD-10-CM

## 2020-08-13 DIAGNOSIS — E039 Hypothyroidism, unspecified: Secondary | ICD-10-CM | POA: Diagnosis present

## 2020-08-13 DIAGNOSIS — Z9119 Patient's noncompliance with other medical treatment and regimen: Secondary | ICD-10-CM

## 2020-08-13 DIAGNOSIS — R0902 Hypoxemia: Secondary | ICD-10-CM

## 2020-08-13 DIAGNOSIS — E861 Hypovolemia: Secondary | ICD-10-CM | POA: Diagnosis not present

## 2020-08-13 DIAGNOSIS — I10 Essential (primary) hypertension: Secondary | ICD-10-CM | POA: Diagnosis not present

## 2020-08-13 DIAGNOSIS — Z5329 Procedure and treatment not carried out because of patient's decision for other reasons: Secondary | ICD-10-CM | POA: Diagnosis not present

## 2020-08-13 DIAGNOSIS — D638 Anemia in other chronic diseases classified elsewhere: Secondary | ICD-10-CM | POA: Diagnosis present

## 2020-08-13 DIAGNOSIS — F0390 Unspecified dementia without behavioral disturbance: Secondary | ICD-10-CM | POA: Diagnosis present

## 2020-08-13 DIAGNOSIS — E785 Hyperlipidemia, unspecified: Secondary | ICD-10-CM | POA: Diagnosis present

## 2020-08-13 DIAGNOSIS — Z452 Encounter for adjustment and management of vascular access device: Secondary | ICD-10-CM

## 2020-08-13 DIAGNOSIS — E860 Dehydration: Secondary | ICD-10-CM | POA: Diagnosis present

## 2020-08-13 DIAGNOSIS — H409 Unspecified glaucoma: Secondary | ICD-10-CM | POA: Diagnosis present

## 2020-08-13 DIAGNOSIS — R339 Retention of urine, unspecified: Secondary | ICD-10-CM | POA: Diagnosis present

## 2020-08-13 LAB — CBC WITH DIFFERENTIAL/PLATELET
Abs Immature Granulocytes: 0 10*3/uL (ref 0.00–0.07)
Basophils Absolute: 0 10*3/uL (ref 0.0–0.1)
Basophils Relative: 0 %
Eosinophils Absolute: 0 10*3/uL (ref 0.0–0.5)
Eosinophils Relative: 0 %
HCT: 32.4 % — ABNORMAL LOW (ref 36.0–46.0)
Hemoglobin: 10.5 g/dL — ABNORMAL LOW (ref 12.0–15.0)
Lymphocytes Relative: 4 %
Lymphs Abs: 0.5 10*3/uL — ABNORMAL LOW (ref 0.7–4.0)
MCH: 31.2 pg (ref 26.0–34.0)
MCHC: 32.4 g/dL (ref 30.0–36.0)
MCV: 96.1 fL (ref 80.0–100.0)
Monocytes Absolute: 0.7 10*3/uL (ref 0.1–1.0)
Monocytes Relative: 6 %
Neutro Abs: 11.1 10*3/uL — ABNORMAL HIGH (ref 1.7–7.7)
Neutrophils Relative %: 90 %
Platelets: 136 10*3/uL — ABNORMAL LOW (ref 150–400)
RBC: 3.37 MIL/uL — ABNORMAL LOW (ref 3.87–5.11)
RDW: 14.8 % (ref 11.5–15.5)
WBC: 12.3 10*3/uL — ABNORMAL HIGH (ref 4.0–10.5)
nRBC: 0 % (ref 0.0–0.2)
nRBC: 0 /100 WBC

## 2020-08-13 LAB — COMPREHENSIVE METABOLIC PANEL
ALT: 24 U/L (ref 0–44)
AST: 38 U/L (ref 15–41)
Albumin: 2.6 g/dL — ABNORMAL LOW (ref 3.5–5.0)
Alkaline Phosphatase: 41 U/L (ref 38–126)
Anion gap: 10 (ref 5–15)
BUN: 18 mg/dL (ref 8–23)
CO2: 22 mmol/L (ref 22–32)
Calcium: 8.6 mg/dL — ABNORMAL LOW (ref 8.9–10.3)
Chloride: 103 mmol/L (ref 98–111)
Creatinine, Ser: 1.05 mg/dL — ABNORMAL HIGH (ref 0.44–1.00)
GFR, Estimated: 51 mL/min — ABNORMAL LOW (ref >60)
Glucose, Bld: 87 mg/dL (ref 70–99)
Potassium: 3.3 mmol/L — ABNORMAL LOW (ref 3.5–5.1)
Sodium: 135 mmol/L (ref 135–145)
Total Bilirubin: 2.5 mg/dL — ABNORMAL HIGH (ref 0.3–1.2)
Total Protein: 6.2 g/dL — ABNORMAL LOW (ref 6.5–8.1)

## 2020-08-13 LAB — RESP PANEL BY RT-PCR (FLU A&B, COVID) ARPGX2
Influenza A by PCR: NEGATIVE
Influenza B by PCR: NEGATIVE
SARS Coronavirus 2 by RT PCR: NEGATIVE

## 2020-08-13 LAB — APTT: aPTT: 39 seconds — ABNORMAL HIGH (ref 24–36)

## 2020-08-13 LAB — LACTIC ACID, PLASMA
Lactic Acid, Venous: 1.2 mmol/L (ref 0.5–1.9)
Lactic Acid, Venous: 1.1 mmol/L (ref 0.5–1.9)

## 2020-08-13 LAB — CBG MONITORING, ED: Glucose-Capillary: 79 mg/dL (ref 70–99)

## 2020-08-13 LAB — PROTIME-INR
INR: 1.7 — ABNORMAL HIGH (ref 0.8–1.2)
Prothrombin Time: 19.2 seconds — ABNORMAL HIGH (ref 11.4–15.2)

## 2020-08-13 MED ORDER — SODIUM CHLORIDE 0.9 % IV SOLN
500.0000 mg | INTRAVENOUS | Status: DC
  Administered 2020-08-13 – 2020-08-15 (×3): 500 mg via INTRAVENOUS
  Filled 2020-08-13 (×4): qty 500

## 2020-08-13 MED ORDER — LATANOPROST 0.005 % OP SOLN
1.0000 [drp] | Freq: Every day | OPHTHALMIC | Status: DC
  Administered 2020-08-13 – 2020-08-21 (×9): 1 [drp] via OPHTHALMIC
  Filled 2020-08-13 (×2): qty 2.5

## 2020-08-13 MED ORDER — METOPROLOL TARTRATE 5 MG/5ML IV SOLN
5.0000 mg | Freq: Once | INTRAVENOUS | Status: AC
  Administered 2020-08-13: 5 mg via INTRAVENOUS
  Filled 2020-08-13: qty 5

## 2020-08-13 MED ORDER — DILTIAZEM HCL-DEXTROSE 125-5 MG/125ML-% IV SOLN (PREMIX)
5.0000 mg/h | INTRAVENOUS | Status: DC
  Administered 2020-08-13: 5 mg/h via INTRAVENOUS
  Administered 2020-08-14 – 2020-08-15 (×2): 15 mg/h via INTRAVENOUS
  Filled 2020-08-13 (×5): qty 125

## 2020-08-13 MED ORDER — SODIUM CHLORIDE 0.9 % IV SOLN
2.0000 g | INTRAVENOUS | Status: DC
  Administered 2020-08-13 – 2020-08-14 (×2): 2 g via INTRAVENOUS
  Filled 2020-08-13: qty 2
  Filled 2020-08-13: qty 20

## 2020-08-13 MED ORDER — HEPARIN (PORCINE) 25000 UT/250ML-% IV SOLN
900.0000 [IU]/h | INTRAVENOUS | Status: DC
  Filled 2020-08-13: qty 250

## 2020-08-13 MED ORDER — DORZOLAMIDE HCL-TIMOLOL MAL 2-0.5 % OP SOLN
1.0000 [drp] | Freq: Two times a day (BID) | OPHTHALMIC | Status: DC
  Administered 2020-08-13 – 2020-08-22 (×18): 1 [drp] via OPHTHALMIC
  Filled 2020-08-13: qty 10

## 2020-08-13 MED ORDER — HEPARIN (PORCINE) 25000 UT/250ML-% IV SOLN
900.0000 [IU]/h | INTRAVENOUS | Status: DC
  Administered 2020-08-13: 900 [IU]/h via INTRAVENOUS
  Filled 2020-08-13: qty 250

## 2020-08-13 MED ORDER — LACTATED RINGERS IV BOLUS (SEPSIS)
1000.0000 mL | Freq: Once | INTRAVENOUS | Status: AC
  Administered 2020-08-13: 1000 mL via INTRAVENOUS

## 2020-08-13 MED ORDER — LACTATED RINGERS IV SOLN: INTRAVENOUS | Status: AC

## 2020-08-13 MED ORDER — ACETAMINOPHEN 500 MG PO TABS
1000.0000 mg | ORAL_TABLET | Freq: Once | ORAL | Status: AC
  Administered 2020-08-13: 1000 mg via ORAL
  Filled 2020-08-13: qty 2

## 2020-08-13 MED ORDER — PRIMIDONE 250 MG PO TABS
250.0000 mg | ORAL_TABLET | Freq: Three times a day (TID) | ORAL | Status: DC
  Administered 2020-08-13 – 2020-08-14 (×5): 250 mg via ORAL
  Filled 2020-08-13 (×7): qty 1

## 2020-08-13 MED ORDER — LACTATED RINGERS IV SOLN: INTRAVENOUS | Status: DC

## 2020-08-13 MED ORDER — LEVOTHYROXINE SODIUM 88 MCG PO TABS
88.0000 ug | ORAL_TABLET | Freq: Every morning | ORAL | Status: DC
  Administered 2020-08-14: 88 ug via ORAL
  Filled 2020-08-13: qty 1

## 2020-08-13 MED ORDER — POLYETHYLENE GLYCOL 3350 17 G PO PACK: 17.0000 g | PACK | Freq: Every day | ORAL | Status: DC | PRN

## 2020-08-13 MED ORDER — ATORVASTATIN CALCIUM 10 MG PO TABS
20.0000 mg | ORAL_TABLET | Freq: Every day | ORAL | Status: DC
  Administered 2020-08-14: 20 mg via ORAL
  Filled 2020-08-13: qty 2

## 2020-08-13 MED ORDER — METOPROLOL TARTRATE 25 MG PO TABS
25.0000 mg | ORAL_TABLET | Freq: Two times a day (BID) | ORAL | Status: DC
  Administered 2020-08-13: 25 mg via ORAL
  Filled 2020-08-13: qty 1

## 2020-08-13 NOTE — ED Provider Notes (Signed)
[facility] EMERGENCY DEPARTMENT Provider Note   CSN: [id number] Arrival date & time: 08/28/2020  1147     History Chief Complaint  Patient presents with  . Atrial Fibrillation    Katherine Ewing is a 87 y.o. female.  Pt is an 87y/o female with hx of PAF on Eliquis, MVP, chronic systolic CHF (last EF 45-50% by TTE 05/28/2019), HTN, HLD, hypothyroidism, thrombocytopenia presenting today with 3 days of not feeling well, feeling hot and chilled, poor appetite, cough.  Patient reports she has been generally weak and having myalgias.  She denies any urinary symptoms or diarrhea.  No chest pain, abdominal pain and she denies any shortness of breath.  Patient states she has had palpitations but reports that is pretty common for her.  EMS reports when they arrive today patient had heart rate between 200-220.  She was given a 500 mL bolus without any improvement in her heart rate and then received a total of 20 mg bolus of Cardizem and placed on a 5 mg drip.  They reported her heart rate did improve to the 140s.  Patient denies any recent lower extremity swelling and has been taking her medications as prescribed.  Unknown of any sick contacts recently and she lives at home with her husband who has been feeling fine.  The history is provided by the patient and medical records.  Atrial Fibrillation       Past Medical History:  Diagnosis Date  . Hypertension   . MVP (mitral valve prolapse)     Patient Active Problem List   Diagnosis Date Noted  . BRBPR (bright red blood per rectum) 01/30/2020  . Hypothyroidism 10/18/2018  . Chronic systolic CHF (congestive heart failure) (HCC) 07/17/2018  . Essential hypertension 09/25/2013  . Hyperlipidemia LDL goal <160 09/25/2013  . Paroxysmal atrial fibrillation (HCC) 09/25/2013    Past Surgical History:  Procedure Laterality Date  . ABDOMINAL HYSTERECTOMY    . APPENDECTOMY    . CHOLECYSTECTOMY    . COLONOSCOPY WITH PROPOFOL N/A  02/01/2020   Procedure: COLONOSCOPY WITH PROPOFOL;  Surgeon: Jeani Hawking, MD;  Location: [facility];  Service: Endoscopy;  Laterality: N/A;  . POLYPECTOMY  02/01/2020   Procedure: POLYPECTOMY;  Surgeon: Jeani Hawking, MD;  Location: [facility];  Service: Endoscopy;;     OB History   No obstetric history on file.     Family History  Problem Relation Age of Onset  . Heart disease Mother        possible CAD    Social History   Tobacco Use  . Smoking status: Never Smoker  . Smokeless tobacco: Never Used  Vaping Use  . Vaping Use: Never used  Substance Use Topics  . Alcohol use: No  . Drug use: No    Home Medications Prior to Admission medications   Medication Sig Start Date End Date Taking? Authorizing Provider  apixaban (ELIQUIS) 5 MG TABS tablet Take 5 mg by mouth 2 (two) times daily.  06/12/19   [provider]  atorvastatin (LIPITOR) 20 MG tablet Take 20 mg by mouth daily. 01/04/20   [provider]  diltiazem (CARDIZEM CD) 300 MG 24 hr capsule Take 300 mg by mouth daily. 11/14/19   [provider]  dorzolamide-timolol (COSOPT) 22.3-6.8 MG/ML ophthalmic solution Place 1 drop into both eyes 2 (two) times daily.  12/18/18   [provider]  guaiFENesin (MUCINEX) 600 MG 12 hr tablet Take by mouth 2 (two) times daily as needed for  cough or to loosen phlegm.    [provider]  latanoprost (XALATAN) 0.005 % ophthalmic solution Place 1 drop into both eyes at bedtime.  09/06/18   [provider]  losartan (COZAAR) 25 MG tablet Take 25 mg by mouth daily. 12/05/19   [provider]  metoprolol succinate (TOPROL-XL) 100 MG 24 hr tablet Take 150 mg by mouth daily.  06/12/19   [provider]  primidone (MYSOLINE) 250 MG tablet Take 250 mg by mouth 3 (three) times daily. 03/19/19   [provider]  spironolactone (ALDACTONE) 25 MG tablet Take 25 mg by mouth daily.  04/24/19   [provider]  traMADol  (ULTRAM) 50 MG tablet Take 50 mg by mouth daily as needed for moderate pain. 06/18/20   [provider]    Allergies    Penicillins, Pilocarpine, and Adhesive  [tape]  Review of Systems   Review of Systems  All other systems reviewed and are negative.   Physical Exam Updated Vital Signs BP 114/87   Pulse (!) 36   Temp 100.2 F (37.9 C) (Rectal)   Resp (!) 29   Ht 5\' 2"  (1.575 m)   Wt 61 kg   SpO2 100%   BMI 24.60 kg/m   Physical Exam Vitals and nursing note reviewed.  Constitutional:      General: She is not in acute distress.    Appearance: She is well-developed.  HENT:     Head: Normocephalic and atraumatic.     Mouth/Throat:     Mouth: Mucous membranes are dry.  Eyes:     Pupils: Pupils are equal, round, and reactive to light.  Cardiovascular:     Rate and Rhythm: Tachycardia present. Rhythm irregularly irregular.     Heart sounds: Normal heart sounds. No murmur heard. No friction rub.  Pulmonary:     Effort: Pulmonary effort is normal. Tachypnea present.     Breath sounds: Examination of the left-middle field reveals rhonchi. Examination of the left-lower field reveals rhonchi. Rhonchi present. No wheezing or rales.  Abdominal:     General: Bowel sounds are normal. There is no distension.     Palpations: Abdomen is soft.     Tenderness: There is no abdominal tenderness. There is no guarding or rebound.  Musculoskeletal:        General: No tenderness. Normal range of motion.     Right lower leg: No edema.     Left lower leg: No edema.     Comments: No edema  Skin:    General: Skin is warm and dry.     Findings: No rash.  Neurological:     General: No focal deficit present.     Mental Status: She is alert and oriented to person, place, and time. Mental status is at baseline.     Cranial Nerves: No cranial nerve deficit.  Psychiatric:        Mood and Affect: Mood normal.        Behavior: Behavior normal.     ED Results / Procedures / Treatments    Labs (all labs ordered are listed, but only abnormal results are displayed) Labs Reviewed  COMPREHENSIVE METABOLIC PANEL - Abnormal; Notable for the following components:      Result Value   Potassium 3.3 (*)    Creatinine, Ser 1.05 (*)    Calcium 8.6 (*)    Total Protein 6.2 (*)    Albumin 2.6 (*)    Total Bilirubin 2.5 (*)  GFR, Estimated 51 (*)    All other components within normal limits  CBC WITH DIFFERENTIAL/PLATELET - Abnormal; Notable for the following components:   WBC 12.3 (*)    RBC 3.37 (*)    Hemoglobin 10.5 (*)    HCT 32.4 (*)    Platelets 136 (*)    Neutro Abs 11.1 (*)    Lymphs Abs 0.5 (*)    All other components within normal limits  PROTIME-INR - Abnormal; Notable for the following components:   Prothrombin Time 19.2 (*)    INR 1.7 (*)    All other components within normal limits  APTT - Abnormal; Notable for the following components:   aPTT 39 (*)    All other components within normal limits  RESP PANEL BY RT-PCR (FLU A&B, COVID) ARPGX2  CULTURE, BLOOD (ROUTINE X 2)  CULTURE, BLOOD (ROUTINE X 2)  URINE CULTURE  LACTIC ACID, PLASMA  LACTIC ACID, PLASMA  URINALYSIS, ROUTINE W REFLEX MICROSCOPIC  CBG MONITORING, ED    EKG EKG Interpretation  Date/Time:  Wednesday August 13 2020 11:52:00 EDT Ventricular Rate:  159 PR Interval:    QRS Duration: 89 QT Interval:  291 QTC Calculation: 474 R Axis:   135 Text Interpretation: Atrial fibrillation with rapid V-rate Ventricular premature complex Right axis deviation Low voltage, precordial leads Abnormal lateral Q waves ST depression, probably rate related Confirmed by Gwyneth Sprout ([id number]) on 08/15/2020 12:04:49 PM   Radiology DG Chest Port 1 View  Result Date: 08/06/2020 CLINICAL DATA:  Cough for 2 weeks, fever, atrial fibrillation, question sepsis EXAM: PORTABLE CHEST 1 VIEW COMPARISON:  Portable exam 1224 hours compared to 06/22/2020 FINDINGS: Enlargement of cardiac silhouette. Mediastinal  contours and pulmonary vascularity normal. Atherosclerotic calcification aorta. New RIGHT upper lobe infiltrate consistent with pneumonia. Remaining lungs clear. No pleural effusion or pneumothorax. Bones demineralized with dextroconvex thoracolumbar scoliosis. IMPRESSION: Enlargement of cardiac silhouette. New RIGHT upper lobe infiltrate consistent with pneumonia. Aortic Atherosclerosis (ICD10-I70.0). Electronically Signed   By: Ulyses Southward M.D.   On: 08/19/2020 12:34    Procedures Procedures   Medications Ordered in ED Medications  lactated ringers infusion (has no administration in time range)  lactated ringers bolus 1,000 mL (has no administration in time range)  cefTRIAXone (ROCEPHIN) 2 g in sodium chloride 0.9 % 100 mL IVPB (has no administration in time range)  azithromycin (ZITHROMAX) 500 mg in sodium chloride 0.9 % 250 mL IVPB (has no administration in time range)    ED Course  I have reviewed the triage vital signs and the nursing notes.  Pertinent labs & imaging results that were available during my care of the patient were reviewed by me and considered in my medical decision making (see chart for details).    MDM Rules/Calculators/A&P                          Patient is an 87 year old female presenting today with atrial fibrillation with RVR, 3 days of poor intake, cough and chills.  Upon arrival here patient's heart rate is between 140-150 she is hot to the touch and rectal temperature is 100.2.  She has an ongoing wet sounding cough but is mentating normally.  She is satting 100% on room air and is in no acute distress.  Concern for sepsis and sepsis work-up was initiated.  Concerned that the atrial fibrillation with RVR is secondary due to underlying infection.  She was started on antibiotics to cover respiratory illness.  Labs are pending.  Patient given a fluid bolus but given her EF of 45 to 50% we will start with 1 L.  She did receive 500 mL with EMS.  30/kg would be 2 L  total.  Will reassess after patient receives fluid to ensure that she does not need further rate control.  She was also given Tylenol for fever.  3:16 PM Patient's tachycardia has improved some with fluids to the 120- 130s.  Leukocytosis of 12.3 but otherwise stable hemoglobin, CMP without acute findings, lactic acid within normal limits, chest x-ray with evidence of right upper lobe pneumonia.  Patient covered with IV antibiotics.  We will plan on admitting and may still need some rate control.  Covid is negative.  Patient admitted for further care.  MDM Number of Diagnoses or Management Options   Amount and/or Complexity of Data Reviewed Clinical lab tests: reviewed and ordered Tests in the radiology section of CPT: ordered and reviewed Tests in the medicine section of CPT: ordered Decide to obtain previous medical records or to obtain history from someone other than the patient: yes Obtain history from someone other than the patient: yes Review and summarize past medical records: yes Discuss the patient with other providers: yes Independent visualization of images, tracings, or specimens: yes  Risk of Complications, Morbidity, and/or Mortality Presenting problems: high Diagnostic procedures: high Management options: high  Patient Progress Patient progress: stable  CRITICAL CARE Performed by: Freyja Govea Total critical care time: 30 minutes Critical care time was exclusive of separately billable procedures and treating other patients. Critical care was necessary to treat or prevent imminent or life-threatening deterioration. Critical care was time spent personally by me on the following activities: development of treatment plan with patient and/or surrogate as well as nursing, discussions with consultants, evaluation of patient's response to treatment, examination of patient, obtaining history from patient or surrogate, ordering and performing treatments and interventions,  ordering and review of laboratory studies, ordering and review of radiographic studies, pulse oximetry and re-evaluation of patient's condition.  Final Clinical Impression(s) / ED Diagnoses Final diagnoses:  Atrial fibrillation with RVR (HCC)  Community acquired pneumonia of right upper lobe of lung  Sepsis without acute organ dysfunction, due to unspecified organism [facility](HCC)    Rx / DC Orders ED Discharge Orders    None       Gwyneth SproutPlunkett, Peighton Edgin, MD July 02, 2020 1519

## 2020-08-13 NOTE — Sepsis Progress Note (Signed)
Sepsis protocol being monitored by eLink. 

## 2020-08-13 NOTE — ED Notes (Signed)
Xray at the bedside.

## 2020-08-13 NOTE — ED Triage Notes (Signed)
Pt bib ems from home with reports of decreased PO intake for the last few days along with cough X2 weeks. On scene pt with HR 220 afib RVR. Pt with hx afib. Pt given total of 20mg  cardizem en route and started on 5mg  drip. Pt HR now 150-200.

## 2020-08-13 NOTE — Progress Notes (Addendum)
ANTICOAGULATION CONSULT NOTE - Initial Consult  Pharmacy Consult for heparin Indication: atrial fibrillation  Allergies  Allergen Reactions  . Penicillins Hives    Did it involve swelling of the face/tongue/throat, SOB, or low BP? Y Did it involve sudden or severe rash/hives, skin peeling, or any reaction on the inside of your mouth or nose? Y Did you need to seek medical attention at a hospital or doctor's office? N When did it last happen?Decades Ago If all above answers are "NO", may proceed with cephalosporin use.    . Pilocarpine Other (See Comments)    headache  . Adhesive  [Tape] Rash    Patient Measurements: Height: 5\' 2"  (157.5 cm) Weight: 61 kg (134 lb 7.7 oz) IBW/kg (Calculated) : 50.1 Heparin Dosing Weight: 61 kg   Vital Signs: Temp: 98.4 F (36.9 C) (03/16 1348) Temp Source: Oral (03/16 1348) BP: 107/85 (03/16 1615) Pulse Rate: 136 (03/16 1615)  Labs: Recent Labs    08/04/2020 1204  HGB 10.5*  HCT 32.4*  PLT 136*  APTT 39*  LABPROT 19.2*  INR 1.7*  CREATININE 1.05*    Estimated Creatinine Clearance: 32.5 mL/min (A) (by C-G formula based on SCr of 1.05 mg/dL (H)).   Medical History: Past Medical History:  Diagnosis Date  . Hypertension   . MVP (mitral valve prolapse)     Medications:  Medications Prior to Admission  Medication Sig Dispense Refill Last Dose  . apixaban (ELIQUIS) 5 MG TABS tablet Take 5 mg by mouth 2 (two) times daily.    08/22/2020 at 0900  . atorvastatin (LIPITOR) 20 MG tablet Take 20 mg by mouth daily.   08/06/2020 at Unknown time  . Cyanocobalamin (VITAMIN B-12 PO) Take 1 tablet by mouth daily.   08/25/2020 at Unknown time  . diltiazem (CARDIZEM CD) 300 MG 24 hr capsule Take 300 mg by mouth daily.   08/03/2020 at Unknown time  . dorzolamide-timolol (COSOPT) 22.3-6.8 MG/ML ophthalmic solution Place 1 drop into both eyes 2 (two) times daily.    08/14/2020 at Unknown time  . furosemide (LASIX) 40 MG tablet Take 20 mg by mouth  daily.   08/20/2020 at Unknown time  . latanoprost (XALATAN) 0.005 % ophthalmic solution Place 1 drop into both eyes at bedtime.    08/12/2020 at Unknown time  . levothyroxine (SYNTHROID) 88 MCG tablet Take 88 mcg by mouth every morning.   08/27/2020 at Unknown time  . losartan (COZAAR) 25 MG tablet Take 25 mg by mouth daily.   08/09/2020 at Unknown time  . metoprolol succinate (TOPROL-XL) 100 MG 24 hr tablet Take 150 mg by mouth daily.    08/11/2020 at 0900  . polyethylene glycol (MIRALAX / GLYCOLAX) 17 g packet Take 17 g by mouth daily as needed for mild constipation.   Past Month at Unknown time  . primidone (MYSOLINE) 250 MG tablet Take 250 mg by mouth 3 (three) times daily.   08/19/2020 at Unknown time  . spironolactone (ALDACTONE) 25 MG tablet Take 25 mg by mouth daily.    08/25/2020 at Unknown time  . traMADol (ULTRAM) 50 MG tablet Take 50 mg by mouth daily as needed for moderate pain.   08/05/2020 at Unknown time    Assessment: 28 YOF who who presented with feeling unwell (tired, no appetite, myalgias). She is on apixaban at home for h/o Afib. Pharmacy consulted to transition patient to IV heparin until patient is more stable.   H/H and Plt low, Plt 1.05  Goal of Therapy:  Heparin level 0.3-0.7 units/ml aPTT 66-102 seconds Monitor platelets by anticoagulation protocol: Yes   Plan:  -Start heparin infusion at 900 units/hr at 9 PM today -F/u 8 hr aPTT, HL -Monitor daily HL, aPTT, CBC and s/s of bleeding   Vinnie Level, PharmD., BCPS, BCCCP Clinical Pharmacist Please refer to [facility] for unit-specific pharmacist

## 2020-08-13 NOTE — H&P (Signed)
[facility] History & Physical  Vinson Moselle MD  Referring Provider: Dr. Franklyn Lor Sicard 07/30/2020  Chief Complaint: Cough, poor po intake HPI: The patient is a 87 y.o. year-old w/ hx of HTN, MV prolapse, PAF on eliquis, chronic syst CHF (last EF 45-50% 05/2019), hypothyroidism, thrombocytopenia presented to ED w/ 3d hx of feeling tired, no appetite, hot and cold w/ cough. +myalgias. No dysuria, no CP or abd pain, no n/v/d. No SOB. +palpitations. On EMS arrival HR was high 200-220, and BP's low. She rec'd 500 cc bolus and 20mg  bolus Cardizem IV w/ 5 mg / hr drip. Heart rate improved into the 140's. Pt lives at home w/ her husband, no recent infection/ illness for her contacts/ relatives. CXR in ED showed RUL PNA. WBC 12K, Hb stable, CMP wnl. Lactic acid wnl. Pt rx'd in ED w/ IV abx for PNA. COVID is negative. Asked to see for admission.   Pt seen in room, has been coughing but nothing coming up. No sig SOB or chest pains. +palpitations as above.  No n/v/d or abd pain. No high fevers or chills. Feeling a little better now in ED.  Awaiting bed assignment.      ROS  denies CP  no joint pain   no HA  no blurry vision  no rash  no diarrhea  no nausea/ vomiting  no dysuria  no difficulty voiding  no change in urine color   Past Medical History  Past Medical History:  Diagnosis Date  . Hypertension   . MVP (mitral valve prolapse)    Past Surgical History  Past Surgical History:  Procedure Laterality Date  . ABDOMINAL HYSTERECTOMY    . APPENDECTOMY    . CHOLECYSTECTOMY    . COLONOSCOPY WITH PROPOFOL N/A 02/01/2020   Procedure: COLONOSCOPY WITH PROPOFOL;  Surgeon: 04/02/2020, MD;  Location: [facility];  Service: Endoscopy;  Laterality: N/A;  . POLYPECTOMY  02/01/2020   Procedure: POLYPECTOMY;  Surgeon: 04/02/2020, MD;  Location: [facility];  Service: Endoscopy;;   Family History  Family History  Problem Relation Age of Onset  . Heart disease Mother         possible CAD   Social History  reports that she has never smoked. She has never used smokeless tobacco. She reports that she does not drink alcohol and does not use drugs. Allergies  Allergies  Allergen Reactions  . Penicillins Hives    Did it involve swelling of the face/tongue/throat, SOB, or low BP? Y Did it involve sudden or severe rash/hives, skin peeling, or any reaction on the inside of your mouth or nose? Y Did you need to seek medical attention at a hospital or doctor's office? N When did it last happen?Decades Ago If all above answers are "NO", may proceed with cephalosporin use.    . Pilocarpine Other (See Comments)    headache  . Adhesive  [Tape] Rash   Home medications Prior to Admission medications   Medication Sig Start Date End Date Taking? Authorizing Provider  apixaban (ELIQUIS) 5 MG TABS tablet Take 5 mg by mouth 2 (two) times daily.  06/12/19  Yes [provider]  atorvastatin (LIPITOR) 20 MG tablet Take 20 mg by mouth daily. 01/04/20  Yes [provider]  Cyanocobalamin (VITAMIN B-12 PO) Take 1 tablet by mouth daily.   Yes [provider]  diltiazem (CARDIZEM CD) 300 MG 24 hr capsule Take 300 mg by mouth daily. 11/14/19  Yes [provider]  dorzolamide-timolol (COSOPT) 22.3-6.8 MG/ML ophthalmic solution Place 1 drop into both eyes 2 (two) times daily.  12/18/18  Yes [provider]  furosemide (LASIX) 40 MG tablet Take 20 mg by mouth daily. 04/23/20  Yes [provider]  latanoprost (XALATAN) 0.005 % ophthalmic solution Place 1 drop into both eyes at bedtime.  09/06/18  Yes [provider]  levothyroxine (SYNTHROID) 88 MCG tablet Take 88 mcg by mouth every morning. 08/01/20  Yes [provider]  losartan (COZAAR) 25 MG tablet Take 25 mg by mouth daily. 12/05/19  Yes [provider]  metoprolol succinate (TOPROL-XL) 100 MG 24 hr tablet Take 150 mg by mouth daily.  06/12/19  Yes  [provider]  polyethylene glycol (MIRALAX / GLYCOLAX) 17 g packet Take 17 g by mouth daily as needed for mild constipation.   Yes [provider]  primidone (MYSOLINE) 250 MG tablet Take 250 mg by mouth 3 (three) times daily. 03/19/19  Yes [provider]  spironolactone (ALDACTONE) 25 MG tablet Take 25 mg by mouth daily.  04/24/19  Yes [provider]  traMADol (ULTRAM) 50 MG tablet Take 50 mg by mouth daily as needed for moderate pain. 06/18/20  Yes [provider]       Exam Gen alert, no distress, nasal O2 2L No rash, cyanosis or gangrene Sclera anicteric, throat clear  No jvd or bruits Chest scattered crackles R lung, L side clear Cor no MRG, +tachy and irregular rhythm Abd soft ntnd no mass or ascites +bs GU normal MS no joint effusions or deformity Ext no LE or UE edema, no wounds or ulcers Neuro is alert, Ox 3 , nf       Home meds:  - cardizem CD 300 qd/ lasix 20 qd/ losartan 25 qd/ metoprolol xl 150 qd/ aldactone 25 qd  - lipitor 20 qd/ eliquis 5 bid  - ultram 50 qd prn/ synthroid 88 ug qd/   - prn's/ vitamins/ supplements    Na 135  K 3.3  CO2 22  BUN 18  cR 1.05  Alb 2.6  Tbili 2.5  AST 38 AlT 24   Lactic acid 1.2  WBC 12K  HB 10.5  mcv 96   Plt 135  INR 1.7   COVID neg/ flu A and B negative    IMPRESSION: Enlargement of cardiac silhouette. New RIGHT upper lobe infiltrate consistent with pneumonia. Aortic Atherosclerosis (ICD10-I70.0).   Assessment/ Plan: 1. CAP - RUL pna by CXR w/ chills and nonprod cough at home. Mild sepsis treated w/ IVF"s and IV abx in ED.  Lactic acid wnl.  2. Hypotension - afib/ RVR + dehydration. Sepsis possible as well.  Cont IVF"s and rate control of atrial fib and IV abx. 3. Afib / RBR - IV dilt 20 mg given by EMS, will continue w/ IV diltiazem gtt 5 -10 mg per hour, also will give lower dose metoprolol po 25 bid for now w/ soft BP's. Will use IV heparin for now here, resume eliquis when  stabilized.  4. HTN - hold ARB and lasix for now, low dose mtp for rate control as above 5. Hypothyroid - cont synthroid 6. Chronic syst CHF - last EF 40-45% range. Looks dry now, giving IVF's and holding lasix / ARB.     Vinson Moselle  MD 08/27/2020, 3:19 PM

## 2020-08-14 ENCOUNTER — Inpatient Hospital Stay (HOSPITAL_COMMUNITY): Payer: Medicare HMO

## 2020-08-14 DIAGNOSIS — I4891 Unspecified atrial fibrillation: Secondary | ICD-10-CM | POA: Diagnosis not present

## 2020-08-14 DIAGNOSIS — E860 Dehydration: Secondary | ICD-10-CM | POA: Diagnosis not present

## 2020-08-14 DIAGNOSIS — I10 Essential (primary) hypertension: Secondary | ICD-10-CM

## 2020-08-14 DIAGNOSIS — J189 Pneumonia, unspecified organism: Secondary | ICD-10-CM | POA: Diagnosis not present

## 2020-08-14 DIAGNOSIS — J690 Pneumonitis due to inhalation of food and vomit: Secondary | ICD-10-CM

## 2020-08-14 DIAGNOSIS — I5022 Chronic systolic (congestive) heart failure: Secondary | ICD-10-CM | POA: Diagnosis not present

## 2020-08-14 LAB — CBC
HCT: 28.5 % — ABNORMAL LOW (ref 36.0–46.0)
Hemoglobin: 9.5 g/dL — ABNORMAL LOW (ref 12.0–15.0)
MCH: 31.3 pg (ref 26.0–34.0)
MCHC: 33.3 g/dL (ref 30.0–36.0)
MCV: 93.8 fL (ref 80.0–100.0)
Platelets: 116 10*3/uL — ABNORMAL LOW (ref 150–400)
RBC: 3.04 MIL/uL — ABNORMAL LOW (ref 3.87–5.11)
RDW: 15 % (ref 11.5–15.5)
WBC: 10.7 10*3/uL — ABNORMAL HIGH (ref 4.0–10.5)
nRBC: 0 % (ref 0.0–0.2)

## 2020-08-14 LAB — PROCALCITONIN: Procalcitonin: 0.25 ng/mL

## 2020-08-14 LAB — APTT
aPTT: 62 seconds — ABNORMAL HIGH (ref 24–36)
aPTT: 57 seconds — ABNORMAL HIGH (ref 24–36)

## 2020-08-14 LAB — HEPARIN LEVEL (UNFRACTIONATED): Heparin Unfractionated: 1 IU/mL — ABNORMAL HIGH (ref 0.30–0.70)

## 2020-08-14 LAB — HIV ANTIBODY (ROUTINE TESTING W REFLEX): HIV Screen 4th Generation wRfx: NONREACTIVE

## 2020-08-14 LAB — BRAIN NATRIURETIC PEPTIDE: B Natriuretic Peptide: 272.4 pg/mL — ABNORMAL HIGH (ref 0.0–100.0)

## 2020-08-14 MED ORDER — APIXABAN 5 MG PO TABS
5.0000 mg | ORAL_TABLET | Freq: Two times a day (BID) | ORAL | Status: DC
  Administered 2020-08-14 (×2): 5 mg via ORAL
  Filled 2020-08-14 (×2): qty 1

## 2020-08-14 MED ORDER — LEVALBUTEROL HCL 0.63 MG/3ML IN NEBU: 0.6300 mg | INHALATION_SOLUTION | Freq: Three times a day (TID) | RESPIRATORY_TRACT | Status: DC

## 2020-08-14 MED ORDER — METOPROLOL SUCCINATE ER 50 MG PO TB24: 150.0000 mg | ORAL_TABLET | Freq: Every day | ORAL | Status: DC

## 2020-08-14 MED ORDER — METRONIDAZOLE IN NACL 5-0.79 MG/ML-% IV SOLN
500.0000 mg | Freq: Three times a day (TID) | INTRAVENOUS | Status: DC
  Administered 2020-08-14 – 2020-08-15 (×3): 500 mg via INTRAVENOUS
  Filled 2020-08-14 (×3): qty 100

## 2020-08-14 MED ORDER — LEVALBUTEROL HCL 0.63 MG/3ML IN NEBU
0.6300 mg | INHALATION_SOLUTION | Freq: Three times a day (TID) | RESPIRATORY_TRACT | Status: DC
  Administered 2020-08-14 (×2): 0.63 mg via RESPIRATORY_TRACT
  Filled 2020-08-14 (×2): qty 3

## 2020-08-14 MED ORDER — IPRATROPIUM BROMIDE 0.02 % IN SOLN: 0.5000 mg | Freq: Three times a day (TID) | RESPIRATORY_TRACT | Status: DC

## 2020-08-14 MED ORDER — DIGOXIN 125 MCG PO TABS: 0.1250 mg | ORAL_TABLET | Freq: Every day | ORAL | Status: DC

## 2020-08-14 MED ORDER — IPRATROPIUM BROMIDE 0.02 % IN SOLN
0.5000 mg | Freq: Three times a day (TID) | RESPIRATORY_TRACT | Status: DC
  Administered 2020-08-14 (×2): 0.5 mg via RESPIRATORY_TRACT
  Filled 2020-08-14 (×2): qty 2.5

## 2020-08-14 MED ORDER — POTASSIUM CHLORIDE CRYS ER 20 MEQ PO TBCR
40.0000 meq | EXTENDED_RELEASE_TABLET | Freq: Once | ORAL | Status: AC
  Administered 2020-08-14: 40 meq via ORAL
  Filled 2020-08-14: qty 2

## 2020-08-14 MED ORDER — HALOPERIDOL LACTATE 5 MG/ML IJ SOLN
2.0000 mg | Freq: Once | INTRAMUSCULAR | Status: AC
  Administered 2020-08-14: 2 mg via INTRAVENOUS
  Filled 2020-08-14: qty 1

## 2020-08-14 MED ORDER — DM-GUAIFENESIN ER 30-600 MG PO TB12
1.0000 | ORAL_TABLET | Freq: Two times a day (BID) | ORAL | Status: DC | PRN
  Filled 2020-08-14: qty 1

## 2020-08-14 MED ORDER — LORAZEPAM 2 MG/ML IJ SOLN
0.5000 mg | Freq: Once | INTRAMUSCULAR | Status: AC
  Administered 2020-08-14: 0.5 mg via INTRAVENOUS
  Filled 2020-08-14: qty 1

## 2020-08-14 MED ORDER — ACETAMINOPHEN 325 MG PO TABS
650.0000 mg | ORAL_TABLET | Freq: Four times a day (QID) | ORAL | Status: DC | PRN
  Administered 2020-08-14: 650 mg via ORAL
  Filled 2020-08-14: qty 2

## 2020-08-14 MED ORDER — METOPROLOL TARTRATE 50 MG PO TABS
50.0000 mg | ORAL_TABLET | Freq: Two times a day (BID) | ORAL | Status: DC
  Administered 2020-08-14 (×2): 50 mg via ORAL
  Filled 2020-08-14 (×2): qty 1

## 2020-08-14 MED ORDER — SODIUM CHLORIDE 0.9 % IV SOLN
2.0000 g | INTRAVENOUS | Status: AC
  Administered 2020-08-15 – 2020-08-19 (×5): 2 g via INTRAVENOUS
  Filled 2020-08-14 (×4): qty 20
  Filled 2020-08-14: qty 2

## 2020-08-14 MED ORDER — SENNOSIDES-DOCUSATE SODIUM 8.6-50 MG PO TABS: 1.0000 | ORAL_TABLET | Freq: Every evening | ORAL | Status: DC | PRN

## 2020-08-14 NOTE — Plan of Care (Signed)

## 2020-08-14 NOTE — TOC Initial Note (Signed)
Transition of Care [facility]) - Initial/Assessment Note    Patient Details  Name: Katherine Ewing MRN: [id number] Date of Birth: 05-07-1931  [facility] of [facility]) CM/SW Contact:    Joanne Chars, LCSW Phone Number: 08/14/2020, 3:34 PM  Clinical Narrative:     CSW met with pt and husband to discuss discharge recommendation for [facility]. Pt very hard of hearing, does give permission for CSW to speak with husband as well.  Pt initially did not appear to understand CSW question about HH.  Husband stating he was "not impressed" with some things from today, but was engaged in the conversation.  With husband's help, CSW conveyed to pt the recommendation for [facility] after discharge and the pt declined this.  Pt was able to verbalize back to CSW what we were discussing.  Husband also states that he is not in favor of HH.  CSW discussed option of outpt PT and husband said they would think about this.   Husband did ask about help getting a wheelchair that was small enough to be used inside.                Expected Discharge Plan: Home/Self Care Barriers to Discharge: Continued Medical Work up   Patient Goals and CMS Choice        Expected Discharge Plan and Services Expected Discharge Plan: Home/Self Care     Post Acute Care Choice:  (still to be determined) Living arrangements for the past 2 months: Single Family Home                                      Prior Living Arrangements/Services Living arrangements for the past 2 months: Single Family Home Lives with:: Spouse Patient language and need for interpreter reviewed:: Yes        Need for Family Participation in Patient Care: Yes (Comment) Care giver support system in place?: Yes (comment) Current home services: Other (comment) (none) Criminal Activity/Legal Involvement Pertinent to Current Situation/Hospitalization: No - Comment as needed  Activities of Daily Living Home Assistive Devices/Equipment: None ADL Screening (condition at  time of admission) Patient's cognitive ability adequate to safely complete daily activities?: Yes Is the patient deaf or have difficulty hearing?: Yes Does the patient have difficulty seeing, even when wearing glasses/contacts?: No Does the patient have difficulty concentrating, remembering, or making decisions?: No Patient able to express need for assistance with ADLs?: Yes Does the patient have difficulty dressing or bathing?: No Independently performs ADLs?: Yes (appropriate for developmental age) Does the patient have difficulty walking or climbing stairs?: No Weakness of Legs: None Weakness of Arms/Hands: None  Permission Sought/Granted Permission sought to share information with : Family Supports Permission granted to share information with : Yes, Verbal Permission Granted  Share Information with NAME: husband Ronnald Collum           Emotional Assessment Appearance:: Appears stated age Attitude/Demeanor/Rapport: Engaged Affect (typically observed): Inappropriate (confused, off topic) Orientation: : Oriented to Self,Oriented to Place,Oriented to  Time Alcohol / Substance Use: Not Applicable Psych Involvement: No (comment)  Admission diagnosis:  CAP (community acquired pneumonia) [J18.9] Atrial fibrillation with RVR ([place]) [I48.91] Community acquired pneumonia of right upper lobe of lung [J18.9] Sepsis without acute organ dysfunction, due to unspecified organism [facility]) [A41.9] Patient Active Problem List   Diagnosis Date Noted  . Atrial fibrillation with RVR ([place]) 08/27/2020  . Community acquired pneumonia 07/30/2020  . Hypotension  08/06/2020  . Dehydration, moderate 07/30/2020  . CAP (community acquired pneumonia) 08/03/2020  . Sepsis without acute organ dysfunction ([place])   . BRBPR (bright red blood per rectum) 01/30/2020  . Hypothyroidism 10/18/2018  . Chronic systolic CHF (congestive heart failure) ([place]) 07/17/2018  . Essential hypertension 09/25/2013  . Hyperlipidemia LDL  goal <160 09/25/2013  . Paroxysmal atrial fibrillation ([place]) 09/25/2013   PCP:  Doreatha Lew, MD Pharmacy:   [id number] [place] [address]Phone: [phone]: [phone]    Social Determinants of Health (SDOH) Interventions    Readmission Risk Interventions No flowsheet data found.

## 2020-08-14 NOTE — Evaluation (Signed)
Clinical/Bedside Swallow Evaluation Patient Details  Name: Katherine Ewing MRN: [id number] Date of Birth: 1930/07/06  Today's Date: 08/14/2020 Time: SLP Start Time (ACUTE ONLY): 1308 SLP Stop Time (ACUTE ONLY): 1318 SLP Time Calculation (min) (ACUTE ONLY): 10 min  Past Medical History:  Past Medical History:  Diagnosis Date  . Hypertension   . MVP (mitral valve prolapse)    Past Surgical History:  Past Surgical History:  Procedure Laterality Date  . ABDOMINAL HYSTERECTOMY    . APPENDECTOMY    . CHOLECYSTECTOMY    . COLONOSCOPY WITH PROPOFOL N/A 02/01/2020   Procedure: COLONOSCOPY WITH PROPOFOL;  Surgeon: Jeani Hawking, MD;  Location: [facility] ENDOSCOPY;  Service: Endoscopy;  Laterality: N/A;  . POLYPECTOMY  02/01/2020   Procedure: POLYPECTOMY;  Surgeon: Jeani Hawking, MD;  Location: [facility] ENDOSCOPY;  Service: Endoscopy;;   HPI:  87 y.o. year-old w/ hx of HTN, MV prolapse, PAF,  chronic syst CHF, hypothyroidism, thrombocytopenia presented to ED w/ 3d hx of feeling tired, no appetite, hot and cold w/ cough. +myalgias.  She was admitted with concerns of community-acquired pneumonia/aspiration pneumonia with atrial fibrillation with RVR. CXR New RIGHT upper lobe infiltrate consistent with pneumonia.   Assessment / Plan / Recommendation Clinical Impression  Pt has diagnosis of pneumonia and denies history of dysphagia with husband confirmation. She denies GER and does not appear to have a pattern of pna per reviewing prior CXR documentation. Significant hearing loss and right ear is less impaired. Oral-motor examiniation was unremarkable with intact dentition. Self administered multiple sips water via cup/straw and regular texture without indications of aspiration. Respirations were normal without audible congestion. Therapist upgraded texture from clears to regular, thin liquids. No further ST intervention needed. SLP Visit Diagnosis: Dysphagia, unspecified (R13.10)    Aspiration Risk  Mild aspiration  risk    Diet Recommendation Regular;Thin liquid   Liquid Administration via: Cup;Straw Medication Administration: Whole meds with liquid Supervision: Patient able to self feed Postural Changes: Seated upright at 90 degrees    Other  Recommendations Oral Care Recommendations: Oral care BID   Follow up Recommendations None      Frequency and Duration            Prognosis        Swallow Study   General Date of Onset: August 17, 2020 HPI: 87 y.o. year-old w/ hx of HTN, MV prolapse, PAF,  chronic syst CHF, hypothyroidism, thrombocytopenia presented to ED w/ 3d hx of feeling tired, no appetite, hot and cold w/ cough. +myalgias.  She was admitted with concerns of community-acquired pneumonia/aspiration pneumonia with atrial fibrillation with RVR. CXR New RIGHT upper lobe infiltrate consistent with pneumonia. Type of Study: Bedside Swallow Evaluation Previous Swallow Assessment:  (none) Diet Prior to this Study: Thin liquids;Other (Comment) (clear liquids) Temperature Spikes Noted: No Respiratory Status: Nasal cannula History of Recent Intubation: No Behavior/Cognition: Alert;Cooperative;Pleasant mood;Other (Comment) (HOH) Oral Cavity Assessment: Within Functional Limits Oral Care Completed by SLP: No Oral Cavity - Dentition: Adequate natural dentition Vision: Functional for self-feeding Self-Feeding Abilities: Able to feed self Patient Positioning: Upright in chair Baseline Vocal Quality: Normal Volitional Cough: Strong Volitional Swallow: Able to elicit    Oral/Motor/Sensory Function Overall Oral Motor/Sensory Function: Within functional limits   Ice Chips Ice chips: Not tested   Thin Liquid Thin Liquid: Within functional limits Presentation: Cup;Straw    Nectar Thick Nectar Thick Liquid: Not tested   Honey Thick Honey Thick Liquid: Not tested   Puree Puree: Not tested   Solid  Solid: Within functional limits      Royce Macadamia 08/14/2020,1:33 PM Breck Coons  Lonell Face.Ed Nurse, children's [phone] Office [phone]

## 2020-08-14 NOTE — Evaluation (Signed)
Physical Therapy Evaluation Patient Details Name: Divine Mctier MRN: [id number] DOB: 01/24/1931 Today's Date: 08/14/2020   History of Present Illness  87 y.o. female presenting to [facility] ED on 08/06/2020 with 3 days of fatigue, chills, cough. Pt found to be tachy and hypotensive in ED. CXR in ED showed RUL PNA. PMH includes HTN, MV prolapse, PAF on eliquis, chronic syst CHF (last EF 45-50% 05/2019), hypothyroidism, thrombocytopenia.  Clinical Impression  Pt presents to PT with deficits in activity tolerance, power, endurance, gait, balance, functional mobility. Pt is limited by fatigue during session and declines ambulation attempts at this time. Pt is generally weak and requires UE support to maintain balance during transfers. Pt will benefit from aggressive mobilization and PT POC to improve activity tolerance and reduce falls risk. PT recommends discharge home with HHPT and a 3in1 commode pending progress.    Follow Up Recommendations Home health PT;Supervision for mobility/OOB    Equipment Recommendations  3in1 (PT) (TBD, spouse asking about wheelchair, will need to determine after gait assessment)    Recommendations for Other Services       Precautions / Restrictions Precautions Precautions: Fall Precaution Comments: monitor SpO2 Restrictions Weight Bearing Restrictions: No      Mobility  Bed Mobility               General bed mobility comments: not assessed    Transfers Overall transfer level: Needs assistance Equipment used: 1 person hand held assist Transfers: Sit to/from Stand;Stand Pivot Transfers Sit to Stand: Min guard Stand pivot transfers: Min guard          Ambulation/Gait Ambulation/Gait assistance:  (pt declines 2/2 fatigue)              Stairs            Wheelchair Mobility    Modified Rankin (Stroke Patients Only)       Balance Overall balance assessment: Needs assistance Sitting-balance support: No upper extremity supported;Feet  supported Sitting balance-Leahy Scale: Good     Standing balance support: Single extremity supported Standing balance-Leahy Scale: Poor Standing balance comment: reliant on UE support                             Pertinent Vitals/Pain Pain Assessment: No/denies pain    Home Living Family/patient expects to be discharged to:: Private residence Living Arrangements: Spouse/significant other Available Help at Discharge: Family;Available 24 hours/day Type of Home: House Home Access: Stairs to enter Entrance Stairs-Rails: None Entrance Stairs-Number of Steps: 1 Home Layout: One level Home Equipment: Walker - 2 wheels;Cane - single point      Prior Function Level of Independence: Independent         Comments: pt ambulates for household distances, denies use of DME     Hand Dominance        Extremity/Trunk Assessment   Upper Extremity Assessment Upper Extremity Assessment: Generalized weakness    Lower Extremity Assessment Lower Extremity Assessment: Generalized weakness    Cervical / Trunk Assessment Cervical / Trunk Assessment: Kyphotic  Communication   Communication: HOH  Cognition Arousal/Alertness: Awake/alert Behavior During Therapy: WFL for tasks assessed/performed Overall Cognitive Status: Impaired/Different from baseline Area of Impairment: Problem solving                             Problem Solving: Slow processing        General Comments General comments (skin integrity,  edema, etc.): pt tachy into 110s, pt on 2LNC during session, SpO2 stable    Exercises     Assessment/Plan    PT Assessment Patient needs continued PT services  PT Problem List Decreased strength;Decreased activity tolerance;Decreased balance;Decreased mobility;Decreased safety awareness;Decreased knowledge of precautions;Decreased knowledge of use of DME;Cardiopulmonary status limiting activity       PT Treatment Interventions DME instruction;Gait  training;Stair training;Functional mobility training;Therapeutic activities;Balance training;Neuromuscular re-education;Patient/family education    PT Goals (Current goals can be found in the Care Plan section)  Acute Rehab PT Goals Patient Stated Goal: to improve mobility and activity tolerance to baseline PT Goal Formulation: With patient/family Time For Goal Achievement: 08/28/20 Potential to Achieve Goals: Good    Frequency Min 3X/week   Barriers to discharge        Co-evaluation               AM-PAC PT "6 Clicks" Mobility  Outcome Measure Help needed turning from your back to your side while in a flat bed without using bedrails?: A Little Help needed moving from lying on your back to sitting on the side of a flat bed without using bedrails?: A Little Help needed moving to and from a bed to a chair (including a wheelchair)?: A Little Help needed standing up from a chair using your arms (e.g., wheelchair or bedside chair)?: A Little Help needed to walk in hospital room?: A Lot Help needed climbing 3-5 steps with a railing? : A Lot 6 Click Score: 16    End of Session Equipment Utilized During Treatment: Oxygen Activity Tolerance: Patient limited by fatigue Patient left: in chair;with call bell/phone within reach;with chair alarm set Nurse Communication: Mobility status PT Visit Diagnosis: Unsteadiness on feet (R26.81);Other abnormalities of gait and mobility (R26.89);Muscle weakness (generalized) (M62.81)    Time: 5916-3846 PT Time Calculation (min) (ACUTE ONLY): 20 min   Charges:   PT Evaluation $PT Eval Low Complexity: 1 Low          Arlyss Gandy, PT, DPT Acute Rehabilitation Pager: [phone]   Arlyss Gandy 08/14/2020, 11:59 AM

## 2020-08-14 NOTE — Progress Notes (Signed)
   08/03/2020 1802  Assess: MEWS Score  Temp 98.3 F (36.8 C)  BP 124/82  Pulse Rate (!) 147  ECG Heart Rate (!) 131  Resp (!) 26  Level of Consciousness Alert  SpO2 96 %  O2 Device Nasal Cannula  O2 Flow Rate (L/min) 2 L/min  Assess: MEWS Score  MEWS Temp 0  MEWS Systolic 0  MEWS Pulse 3  MEWS RR 2  MEWS LOC 0  MEWS Score 5  MEWS Score Color Red  Assess: if the MEWS score is Yellow or Red  Were vital signs taken at a resting state? Yes  Focused Assessment No change from prior assessment  Early Detection of Sepsis Score *See Row Information* High  MEWS guidelines implemented *See Row Information* Yes  Treat  MEWS Interventions Administered scheduled meds/treatments  Pain Scale 0-10  Pain Score 0  Take Vital Signs  Increase Vital Sign Frequency  Red: Q 1hr X 4 then Q 4hr X 4, if remains red, continue Q 4hrs  Escalate  MEWS: Escalate Red: discuss with charge nurse/RN and provider, consider discussing with RRT  Notify: Charge Nurse/RN  Name of Charge Nurse/RN Notified Vernona Rieger, RN  Date Charge Nurse/RN Notified 08/22/2020  Time Charge Nurse/RN Notified 1815

## 2020-08-14 NOTE — Consult Note (Addendum)
CARDIOLOGY CONSULT NOTE   Patient ID: Katherine Ewing MRN: [id number], DOB/AGE: 12/29/1930   Admit date: 08/24/2020 Date of Consult: 08/14/2020  Primary Physician: Randel Pigg, Dorma Russell, MD Primary Cardiologist: Christell Constant, MD  Reason for consult: Atrial fibrillation with RVR  Problem List  Past Medical History:  Diagnosis Date  . Hypertension   . MVP (mitral valve prolapse)     Past Surgical History:  Procedure Laterality Date  . ABDOMINAL HYSTERECTOMY    . APPENDECTOMY    . CHOLECYSTECTOMY    . COLONOSCOPY WITH PROPOFOL N/A 02/01/2020   Procedure: COLONOSCOPY WITH PROPOFOL;  Surgeon: Jeani Hawking, MD;  Location: [facility];  Service: Endoscopy;  Laterality: N/A;  . POLYPECTOMY  02/01/2020   Procedure: POLYPECTOMY;  Surgeon: Jeani Hawking, MD;  Location: [facility];  Service: Endoscopy;;    Allergies  Allergies  Allergen Reactions  . Penicillins Hives    Did it involve swelling of the face/tongue/throat, SOB, or low BP? Y Did it involve sudden or severe rash/hives, skin peeling, or any reaction on the inside of your mouth or nose? Y Did you need to seek medical attention at a hospital or doctor's office? N When did it last happen?Decades Ago If all above answers are "NO", may proceed with cephalosporin use.    . [place] Other (See Comments)    headache  . Adhesive  [Tape] Rash   HPI   Katherine Ewing is a 87 y.o. female with a history of persistent atrial fibrillation/atrial flutter on Eliquis, previously failed cardioversion, not a candidate for ablation, tachy-mediated cardiomyopathy with EF as low as 30-35% but improved to 45-50% on Echo in 05/2019, prior PE/DVT on chronic anticoagulation, GI bleed felt to be secondary to hemorrhoids, colonoscopy secondary to GI bleed in September 2021 and per GI okay to resume Eliquis, thrombocytopenia, hypertension, hypothyroidism, glaucoma, severe dementia.  Patient was admitted on June 22, 2020 with  symptomatic A. fib with RVR with palpitations and hypotension, it was believed that her rapid heart rate was secondary to noncompliance at home, her heart rate improved with restarting of her home Cardizem.    Patient again admitted with palpitations, fatigue, no appetite, hot and cold w/ cough. +myalgias. No dysuria, no CP or abd pain, no n/v/d. No SOB. +palpitations. On EMS arrival HR was high 200-220, and BP's low. She rec'd 500 cc bolus and 20mg  bolus Cardizem IV w/ 5 mg / hr drip. She remains on cardizem drip and her ventricular rates remains in 120-140'. Pt rx'd in ED w/ IV abx for PNA. COVID is negative.  The patient is a poor historian, only oriented to herself.  Any symptoms right now.  Inpatient Medications  . apixaban  5 mg Oral BID  . atorvastatin  20 mg Oral Daily  . dorzolamide-timolol  1 drop Both Eyes BID  . ipratropium  0.5 mg Nebulization Q8H  . latanoprost  1 drop Both Eyes QHS  . levalbuterol  0.63 mg Nebulization Q8H  . levothyroxine  88 mcg Oral q morning  . LORazepam  0.5 mg Intravenous Once  . metoprolol tartrate  50 mg Oral BID  . primidone  250 mg Oral TID   Family History Family History  Problem Relation Age of Onset  . Heart disease Mother        possible CAD    Social History Social History   Socioeconomic History  . Marital status: Married    Spouse name: Not on file  . Number of children: Not on  file  . Years of education: Not on file  . Highest education level: Not on file  Occupational History  . Not on file  Tobacco Use  . Smoking status: Never Smoker  . Smokeless tobacco: Never Used  Vaping Use  . Vaping Use: Never used  Substance and Sexual Activity  . Alcohol use: No  . Drug use: No  . Sexual activity: Not on file  Other Topics Concern  . Not on file  Social History Narrative  . Not on file   Social Determinants of Health   Financial Resource Strain: Not on file  Food Insecurity: Not on file  Transportation Needs: Not on file   Physical Activity: Not on file  Stress: Not on file  Social Connections: Not on file  Intimate Partner Violence: Not on file    Review of Systems  General:  No chills, fever, night sweats or weight changes.  Cardiovascular:  No chest pain, dyspnea on exertion, edema, orthopnea, palpitations, paroxysmal nocturnal dyspnea. Dermatological: No rash, lesions/masses Respiratory: No cough, dyspnea Urologic: No hematuria, dysuria Abdominal:   No nausea, vomiting, diarrhea, bright red blood per rectum, melena, or hematemesis Neurologic:  No visual changes, wkns, changes in mental status. All other systems reviewed and are otherwise negative except as noted above.  Physical Exam  Blood pressure 117/66, pulse (!) 102, temperature 98 F (36.7 C), temperature source Oral, resp. rate (!) 34, height 5\' 2"  (1.575 m), weight 66.5 kg, SpO2 98 %.  General: Pleasant, NAD Psych: Normal affect. Neuro: Alert and oriented X 3. Moves all extremities spontaneously. HEENT: Normal  Neck: Supple without bruits or JVD. Lungs:  Resp regular and unlabored, CTA. Heart: RRR no s3, s4, or murmurs. Abdomen: Soft, non-tender, non-distended, BS + x 4.  Extremities: No clubbing, cyanosis or edema. DP/PT/Radials 2+ and equal bilaterally.  Labs  No results for input(s): CKTOTAL, CKMB, TROPONINI in the last 72 hours. Lab Results  Component Value Date   WBC 10.7 (H) 08/14/2020   HGB 9.5 (L) 08/14/2020   HCT 28.5 (L) 08/14/2020   MCV 93.8 08/14/2020   PLT 116 (L) 08/14/2020    Recent Labs  Lab 08/18/2020 1204  NA 135  K 3.3*  CL 103  CO2 22  BUN 18  CREATININE 1.05*  CALCIUM 8.6*  PROT 6.2*  BILITOT 2.5*  ALKPHOS 41  ALT 24  AST 38  GLUCOSE 87   No results found for: CHOL, HDL, LDLCALC, TRIG No results found for: DDIMER Invalid input(s): POCBNP  Radiology/Studies  DG Chest Port 1 View  Result Date: 08/14/2020 CLINICAL DATA:  Dyspnea. EXAM: PORTABLE CHEST 1 VIEW COMPARISON:  Chest x-ray from  yesterday. FINDINGS: Unchanged mild cardiomegaly. Mildly improved right upper lobe consolidation. No pneumothorax or pleural effusion. No acute osseous abnormality. IMPRESSION: 1. Mildly improved right upper lobe pneumonia. Electronically Signed   By: 08/16/2020 M.D.   On: 08/14/2020 17:00   DG Chest Port 1 View  Result Date: 08/24/2020 CLINICAL DATA:  Cough for 2 weeks, fever, atrial fibrillation, question sepsis EXAM: PORTABLE CHEST 1 VIEW COMPARISON:  Portable exam 1224 hours compared to 06/22/2020 FINDINGS: Enlargement of cardiac silhouette. Mediastinal contours and pulmonary vascularity normal. Atherosclerotic calcification aorta. New RIGHT upper lobe infiltrate consistent with pneumonia. Remaining lungs clear. No pleural effusion or pneumothorax. Bones demineralized with dextroconvex thoracolumbar scoliosis. IMPRESSION: Enlargement of cardiac silhouette. New RIGHT upper lobe infiltrate consistent with pneumonia. Aortic Atherosclerosis (ICD10-I70.0). Electronically Signed   By: 06/24/2020 M.D.   On:  08/08/2020 12:34   Echocardiogram -none in epic  ECG: Atrial fibrillation with RVR ventricular rate 160 bpm   ASSESSMENT AND PLAN  Chronic Atrial Fibrillation now with RVR in the settings of pneumonia -Continue Cardizem drip increase rate to 7.5 mg/h -Continue metoprolol 50 mg p.o. twice daily -Start digoxin 0.125 mg p.o. daily -Continue home Eliquis 5mg  twice daily. -Replace potassium  Tachymediated Cardiomyopathy - Most recent Echo in 05/2019 at [facility] showed LVEF of 45-50%.  - Appears euvolemic on exam. - Continue home medications: Losartan and spironolactone are being held as she was septic. - Will continue to monitor volume status.   Hypertension -Blood pressure rather soft secondary to pneumonia and sepsis.  Prior PE/DVT - On chronic anticoagulation with Eliquis.   Signed, [facility], MD, [facility] 08/14/2020, 6:04 PM

## 2020-08-14 NOTE — Progress Notes (Signed)
PROGRESS NOTE    Katherine Ewing  TTS:[id number] DOB: [birth year] DOA: 08/14/2020 PCP: Randel Pigg, Dorma Russell, MD   Brief Narrative:  87 y.o. year-old w/ hx of HTN, MV prolapse, PAF on eliquis, chronic syst CHF (last EF 45-50% 05/2019), hypothyroidism, thrombocytopenia presented to ED w/ 3d hx of feeling tired, no appetite, hot and cold w/ cough. +myalgias.  She was admitted with concerns of community-acquired pneumonia/aspiration pneumonia with atrial fibrillation with RVR.   Assessment & Plan:   Principal Problem:   Community acquired pneumonia Active Problems:   Chronic systolic CHF (congestive heart failure) (HCC)   Essential hypertension   Atrial fibrillation with RVR (HCC)   Hypotension   Dehydration, moderate   CAP (community acquired pneumonia)  Right upper lobe pneumonia-suspect community-acquired pneumonia versus aspiration pneumonia -We will get speech and swallow evaluation.  Follow-up culture data -Empiric antibiotics-Unasyn and azithromycin -Bronchodilators, incentive spirometer, flutter valve -Out of bed to chair -Supplemental oxygen as necessary  Atrial fibrillation with RVR -Suspect from underlying pulmonary issues.  Monitor electrolytes and replete as necessary -Currently on Cardizem drip.  Will increase metoprolol to 50 mg twice daily.  If blood pressure remains stable, will slowly resume her home oral Cardizem -Transition heparin drip to oral Eliquis -We will consult cardiology for their input  Essential hypertension -Home medications currently on hold besides p.o. metoprolol and IV Cardizem drip at this time  Hypothyroidism -Synthroid  Chronic systolic congestive heart failure, EF 40-45% -Currently no signs of volume overload.  Closely monitor this.  Lasix and ARB is currently on hold   DVT prophylaxis: Eliquis Code Status: Full code Family Communication: Husband present at bedside  Status is: Inpatient  Remains inpatient appropriate because:Inpatient  level of care appropriate due to severity of illness   Dispo: The patient is from: Home              Anticipated d/c is to: Home              Patient currently is not medically stable to d/c.  Patient is still quite dyspneic at rest significant amount of coughing abnormal breath sounds.   Difficult to place patient No       Body mass index is 26.81 kg/m.       Subjective: Patient is having significant amount of coughing during my evaluation.  During coughing spells her heart rate remains between 130-160 atrial fibrillation with RVR  Review of Systems Otherwise negative except as per HPI, including: General: Denies fever, chills, night sweats or unintended weight loss. Resp: Denies hemoptysis Cardiac: Denies chest pain, palpitations, orthopnea, paroxysmal nocturnal dyspnea. GI: Denies abdominal pain, nausea, vomiting, diarrhea or constipation GU: Denies dysuria, frequency, hesitancy or incontinence MS: Denies muscle aches, joint pain or swelling Neuro: Denies headache, neurologic deficits (focal weakness, numbness, tingling), abnormal gait Psych: Denies anxiety, depression, SI/HI/AVH Skin: Denies new rashes or lesions ID: Denies sick contacts, exotic exposures, travel  Examination:  Constitutional: Not in acute distress, 3 L nasal cannula, elderly frail Respiratory: Bilateral rhonchi especially right upper lobe Cardiovascular: Normal sinus rhythm, no rubs Abdomen: Nontender nondistended good bowel sounds Musculoskeletal: No edema noted Skin: No rashes seen Neurologic: CN 2-12 grossly intact.  And nonfocal Psychiatric: Normal judgment and insight. Alert and oriented x 3. Normal mood.     Objective: Vitals:   08/14/20 0600 08/14/20 0700 08/14/20 0710 08/14/20 0756  BP: 125/78 (!) 83/63 113/68 (!) 134/93  Pulse: (!) 121 (!) 138 (!) 120 (!) 120  Resp: (!) 31 (!) 42 (!)  39 (!) 37  Temp:    98.6 F (37 C)  TempSrc:    Oral  SpO2: 92% 90% 94% 95%  Weight:       Height:        Intake/Output Summary (Last 24 hours) at 08/14/2020 1115 Last data filed at Nov 14, 2020 1800 Gross per 24 hour  Intake 1192.22 ml  Output -  Net 1192.22 ml   Filed Weights   01-17-21 1150 01-17-21 1802  Weight: 61 kg 66.5 kg     Data Reviewed:   CBC: Recent Labs  Lab 01-17-21 1204 08/14/20 0317  WBC 12.3* 10.7*  NEUTROABS 11.1*  --   HGB 10.5* 9.5*  HCT 32.4* 28.5*  MCV 96.1 93.8  PLT 136* 116*   Basic Metabolic Panel: Recent Labs  Lab 01-17-21 1204  NA 135  K 3.3*  CL 103  CO2 22  GLUCOSE 87  BUN 18  CREATININE 1.05*  CALCIUM 8.6*   GFR: Estimated Creatinine Clearance: 33.8 mL/min (A) (by C-G formula based on SCr of 1.05 mg/dL (H)). Liver Function Tests: Recent Labs  Lab 01-17-21 1204  AST 38  ALT 24  ALKPHOS 41  BILITOT 2.5*  PROT 6.2*  ALBUMIN 2.6*   No results for input(s): LIPASE, AMYLASE in the last 168 hours. No results for input(s): AMMONIA in the last 168 hours. Coagulation Profile: Recent Labs  Lab 01-17-21 1204  INR 1.7*   Cardiac Enzymes: No results for input(s): CKTOTAL, CKMB, CKMBINDEX, TROPONINI in the last 168 hours. BNP (last 3 results) No results for input(s): PROBNP in the last 8760 hours. HbA1C: No results for input(s): HGBA1C in the last 72 hours. CBG: Recent Labs  Lab 01-17-21 1156  GLUCAP 79   Lipid Profile: No results for input(s): CHOL, HDL, LDLCALC, TRIG, CHOLHDL, LDLDIRECT in the last 72 hours. Thyroid Function Tests: No results for input(s): TSH, T4TOTAL, FREET4, T3FREE, THYROIDAB in the last 72 hours. Anemia Panel: No results for input(s): VITAMINB12, FOLATE, FERRITIN, TIBC, IRON, RETICCTPCT in the last 72 hours. Sepsis Labs: Recent Labs  Lab 01-17-21 1204 01-17-21 1840 08/14/20 0820  PROCALCITON  --   --  0.25  LATICACIDVEN 1.2 1.1  --     Recent Results (from the past 240 hour(s))  Resp Panel by RT-PCR (Flu A&B, Covid) Nasopharyngeal Swab     Status: None   Collection Time:  01-17-21 11:55 AM   Specimen: Nasopharyngeal Swab; Nasopharyngeal(NP) swabs in vial transport medium  Result Value Ref Range Status   SARS Coronavirus 2 by RT PCR NEGATIVE NEGATIVE Final    Comment: (NOTE) SARS-CoV-2 target nucleic acids are NOT DETECTED.  The SARS-CoV-2 RNA is generally detectable in upper respiratory specimens during the acute phase of infection. The lowest concentration of SARS-CoV-2 viral copies this assay can detect is 138 copies/mL. A negative result does not preclude SARS-Cov-2 infection and should not be used as the sole basis for treatment or other patient management decisions. A negative result may occur with  improper specimen collection/handling, submission of specimen other than nasopharyngeal swab, presence of viral mutation(s) within the areas targeted by this assay, and inadequate number of viral copies(<138 copies/mL). A negative result must be combined with clinical observations, patient history, and epidemiological information. The expected result is Negative.  Fact Sheet for Patients:  BloggerCourse.comhttps://www.fda.gov/media/152166/download  Fact Sheet for Healthcare Providers:  SeriousBroker.ithttps://www.fda.gov/media/152162/download  This test is no t yet approved or cleared by the [place] FDA and  has been authorized for detection and/or diagnosis of SARS-CoV-2  by FDA under an Emergency Use Authorization (EUA). This EUA will remain  in effect (meaning this test can be used) for the duration of the COVID-19 declaration under Section 564(b)(1) of the Act, 21 U.S.C.section 360bbb-3(b)(1), unless the authorization is terminated  or revoked sooner.       Influenza A by PCR NEGATIVE NEGATIVE Final   Influenza B by PCR NEGATIVE NEGATIVE Final    Comment: (NOTE) The Xpert Xpress SARS-CoV-2/FLU/RSV plus assay is intended as an aid in the diagnosis of influenza from Nasopharyngeal swab specimens and should not be used as a sole basis for treatment. Nasal washings  and aspirates are unacceptable for Xpert Xpress SARS-CoV-2/FLU/RSV testing.  Fact Sheet for Patients: BloggerCourse.com  Fact Sheet for Healthcare Providers: SeriousBroker.it  This test is not yet approved or cleared by the [place] FDA and has been authorized for detection and/or diagnosis of SARS-CoV-2 by FDA under an Emergency Use Authorization (EUA). This EUA will remain in effect (meaning this test can be used) for the duration of the COVID-19 declaration under Section 564(b)(1) of the Act, 21 U.S.C. section 360bbb-3(b)(1), unless the authorization is terminated or revoked.  Performed at [facility], [address], [place], [place] [address]   Blood Culture (routine x 2)     Status: None (Preliminary result)   Collection Time: 08/27/2020 12:04 PM   Specimen: BLOOD  Result Value Ref Range Status   Specimen Description BLOOD RIGHT ANTECUBITAL  Final   Special Requests   Final    BOTTLES DRAWN AEROBIC AND ANAEROBIC Blood Culture adequate volume   Culture   Final    NO GROWTH < 24 HOURS Performed at [facility], [address], [place], [place] [address]    Report Status PENDING  Incomplete  Blood Culture (routine x 2)     Status: None (Preliminary result)   Collection Time: 08/01/2020 12:31 PM   Specimen: BLOOD  Result Value Ref Range Status   Specimen Description BLOOD SITE NOT SPECIFIED  Final   Special Requests   Final    BOTTLES DRAWN AEROBIC AND ANAEROBIC Blood Culture results may not be optimal due to an excessive volume of blood received in culture bottles   Culture   Final    NO GROWTH < 24 HOURS Performed at [facility], [address], [place], [place] [address]    Report Status PENDING  Incomplete         Radiology Studies: DG Chest Port 1 View  Result Date: 08/22/2020 CLINICAL DATA:  Cough for 2 weeks, fever, atrial fibrillation, question sepsis EXAM: PORTABLE CHEST 1 VIEW  COMPARISON:  Portable exam 1224 hours compared to 06/22/2020 FINDINGS: Enlargement of cardiac silhouette. Mediastinal contours and pulmonary vascularity normal. Atherosclerotic calcification aorta. New RIGHT upper lobe infiltrate consistent with pneumonia. Remaining lungs clear. No pleural effusion or pneumothorax. Bones demineralized with dextroconvex thoracolumbar scoliosis. IMPRESSION: Enlargement of cardiac silhouette. New RIGHT upper lobe infiltrate consistent with pneumonia. Aortic Atherosclerosis (ICD10-I70.0). Electronically Signed   By: Ulyses Southward M.D.   On: 07/30/2020 12:34        Scheduled Meds: . apixaban  5 mg Oral BID  . atorvastatin  20 mg Oral Daily  . dorzolamide-timolol  1 drop Both Eyes BID  . ipratropium  0.5 mg Nebulization Q8H  . latanoprost  1 drop Both Eyes QHS  . levalbuterol  0.63 mg Nebulization Q8H  . levothyroxine  88 mcg Oral q morning  . metoprolol tartrate  50  mg Oral BID  . primidone  250 mg Oral TID   Continuous Infusions: . azithromycin Stopped (08/01/2020 1456)  . cefTRIAXone (ROCEPHIN)  IV 2 g (08/14/20 1055)  . diltiazem (CARDIZEM) infusion 15 mg/hr (08/14/20 0246)  . lactated ringers 75 mL/hr at 08/20/2020 2339     LOS: 1 day   Time spent= 35 mins    Ankit Joline Maxcy, MD [facility]  If 7PM-7AM, please contact night-coverage  08/14/2020, 11:15 AM

## 2020-08-15 ENCOUNTER — Inpatient Hospital Stay (HOSPITAL_COMMUNITY): Payer: Medicare HMO

## 2020-08-15 DIAGNOSIS — I5023 Acute on chronic systolic (congestive) heart failure: Secondary | ICD-10-CM

## 2020-08-15 DIAGNOSIS — J9601 Acute respiratory failure with hypoxia: Secondary | ICD-10-CM | POA: Diagnosis present

## 2020-08-15 DIAGNOSIS — J9690 Respiratory failure, unspecified, unspecified whether with hypoxia or hypercapnia: Secondary | ICD-10-CM

## 2020-08-15 DIAGNOSIS — I9589 Other hypotension: Secondary | ICD-10-CM | POA: Diagnosis not present

## 2020-08-15 DIAGNOSIS — I10 Essential (primary) hypertension: Secondary | ICD-10-CM | POA: Diagnosis not present

## 2020-08-15 DIAGNOSIS — I4891 Unspecified atrial fibrillation: Secondary | ICD-10-CM | POA: Diagnosis not present

## 2020-08-15 DIAGNOSIS — J189 Pneumonia, unspecified organism: Secondary | ICD-10-CM

## 2020-08-15 DIAGNOSIS — A419 Sepsis, unspecified organism: Principal | ICD-10-CM

## 2020-08-15 DIAGNOSIS — I5022 Chronic systolic (congestive) heart failure: Secondary | ICD-10-CM | POA: Diagnosis not present

## 2020-08-15 LAB — STREP PNEUMONIAE URINARY ANTIGEN: Strep Pneumo Urinary Antigen: NEGATIVE

## 2020-08-15 LAB — POCT I-STAT 7, (LYTES, BLD GAS, ICA,H+H)
Acid-base deficit: 1 mmol/L (ref 0.0–2.0)
Bicarbonate: 23 mmol/L (ref 20.0–28.0)
Calcium, Ion: 1.15 mmol/L (ref 1.15–1.40)
HCT: 22 % — ABNORMAL LOW (ref 36.0–46.0)
Hemoglobin: 7.5 g/dL — ABNORMAL LOW (ref 12.0–15.0)
O2 Saturation: 100 %
Patient temperature: 98
Potassium: 3.7 mmol/L (ref 3.5–5.1)
Sodium: 138 mmol/L (ref 135–145)
TCO2: 24 mmol/L (ref 22–32)
pCO2 arterial: 34 mmHg (ref 32.0–48.0)
pH, Arterial: 7.437 (ref 7.350–7.450)
pO2, Arterial: 245 mmHg — ABNORMAL HIGH (ref 83.0–108.0)

## 2020-08-15 LAB — BASIC METABOLIC PANEL WITH GFR
Anion gap: 9 (ref 5–15)
BUN: 12 mg/dL (ref 8–23)
CO2: 26 mmol/L (ref 22–32)
Calcium: 8.4 mg/dL — ABNORMAL LOW (ref 8.9–10.3)
Chloride: 101 mmol/L (ref 98–111)
Creatinine, Ser: 0.76 mg/dL (ref 0.44–1.00)
GFR, Estimated: >60 mL/min
Glucose, Bld: 122 mg/dL — ABNORMAL HIGH (ref 70–99)
Potassium: 2.8 mmol/L — ABNORMAL LOW (ref 3.5–5.1)
Sodium: 136 mmol/L (ref 135–145)

## 2020-08-15 LAB — GLUCOSE, CAPILLARY
Glucose-Capillary: 138 mg/dL — ABNORMAL HIGH (ref 70–99)
Glucose-Capillary: 117 mg/dL — ABNORMAL HIGH (ref 70–99)
Glucose-Capillary: 129 mg/dL — ABNORMAL HIGH (ref 70–99)
Glucose-Capillary: 123 mg/dL — ABNORMAL HIGH (ref 70–99)
Glucose-Capillary: 136 mg/dL — ABNORMAL HIGH (ref 70–99)

## 2020-08-15 LAB — BASIC METABOLIC PANEL
Anion gap: 9 (ref 5–15)
BUN: 15 mg/dL (ref 8–23)
CO2: 22 mmol/L (ref 22–32)
Calcium: 7.9 mg/dL — ABNORMAL LOW (ref 8.9–10.3)
Chloride: 105 mmol/L (ref 98–111)
Creatinine, Ser: 0.85 mg/dL (ref 0.44–1.00)
GFR, Estimated: >60 mL/min (ref >60)
Glucose, Bld: 129 mg/dL — ABNORMAL HIGH (ref 70–99)
Potassium: 4.4 mmol/L (ref 3.5–5.1)
Sodium: 136 mmol/L (ref 135–145)

## 2020-08-15 LAB — CBC
HCT: 28.6 % — ABNORMAL LOW (ref 36.0–46.0)
Hemoglobin: 9.9 g/dL — ABNORMAL LOW (ref 12.0–15.0)
MCH: 32.1 pg (ref 26.0–34.0)
MCHC: 34.6 g/dL (ref 30.0–36.0)
MCV: 92.9 fL (ref 80.0–100.0)
Platelets: 149 K/uL — ABNORMAL LOW (ref 150–400)
RBC: 3.08 MIL/uL — ABNORMAL LOW (ref 3.87–5.11)
RDW: 14.9 % (ref 11.5–15.5)
WBC: 10.3 K/uL (ref 4.0–10.5)
nRBC: 0 % (ref 0.0–0.2)

## 2020-08-15 LAB — MRSA PCR SCREENING: MRSA by PCR: NEGATIVE

## 2020-08-15 LAB — MAGNESIUM: Magnesium: 1.8 mg/dL (ref 1.7–2.4)

## 2020-08-15 MED ORDER — DEXMEDETOMIDINE HCL IN NACL 400 MCG/100ML IV SOLN
0.0000 ug/kg/h | INTRAVENOUS | Status: AC
  Administered 2020-08-15: 0.4 ug/kg/h via INTRAVENOUS
  Administered 2020-08-15 – 2020-08-16 (×2): 0.6 ug/kg/h via INTRAVENOUS
  Administered 2020-08-16: 0.7 ug/kg/h via INTRAVENOUS
  Administered 2020-08-16: 0.6 ug/kg/h via INTRAVENOUS
  Administered 2020-08-17: 0.8 ug/kg/h via INTRAVENOUS
  Administered 2020-08-17 – 2020-08-18 (×4): 1.2 ug/kg/h via INTRAVENOUS
  Filled 2020-08-15 (×11): qty 100

## 2020-08-15 MED ORDER — SODIUM CHLORIDE 0.9 % IV SOLN: INTRAVENOUS | Status: DC

## 2020-08-15 MED ORDER — VITAL AF 1.2 CAL PO LIQD
1000.0000 mL | ORAL | Status: DC
  Administered 2020-08-15 – 2020-08-18 (×4): 1000 mL
  Filled 2020-08-15: qty 1000

## 2020-08-15 MED ORDER — SODIUM CHLORIDE 0.9 % IV SOLN: 250.0000 mL | INTRAVENOUS | Status: DC

## 2020-08-15 MED ORDER — SODIUM CHLORIDE 0.9 % IV BOLUS
1000.0000 mL | Freq: Once | INTRAVENOUS | Status: AC
  Administered 2020-08-15: 1000 mL via INTRAVENOUS

## 2020-08-15 MED ORDER — FUROSEMIDE 10 MG/ML IJ SOLN
40.0000 mg | Freq: Once | INTRAMUSCULAR | Status: AC
  Administered 2020-08-15: 40 mg via INTRAVENOUS

## 2020-08-15 MED ORDER — MIDAZOLAM HCL 2 MG/2ML IJ SOLN
1.0000 mg | INTRAMUSCULAR | Status: DC | PRN
Start: 2020-08-15 — End: 2020-08-19
  Administered 2020-08-15 – 2020-08-17 (×2): 1 mg via INTRAVENOUS
  Filled 2020-08-15 (×2): qty 2

## 2020-08-15 MED ORDER — CHLORHEXIDINE GLUCONATE 0.12% ORAL RINSE (MEDLINE KIT)
15.0000 mL | Freq: Two times a day (BID) | OROMUCOSAL | Status: DC
  Administered 2020-08-15 – 2020-08-19 (×10): 15 mL via OROMUCOSAL

## 2020-08-15 MED ORDER — ACETAMINOPHEN 325 MG PO TABS
650.0000 mg | ORAL_TABLET | Freq: Four times a day (QID) | ORAL | Status: DC | PRN
  Administered 2020-08-20: 650 mg
  Filled 2020-08-15: qty 2

## 2020-08-15 MED ORDER — LEVALBUTEROL HCL 0.63 MG/3ML IN NEBU: 0.6300 mg | INHALATION_SOLUTION | Freq: Two times a day (BID) | RESPIRATORY_TRACT | Status: DC

## 2020-08-15 MED ORDER — VITAL HIGH PROTEIN PO LIQD
1000.0000 mL | ORAL | Status: DC
  Filled 2020-08-15: qty 1000

## 2020-08-15 MED ORDER — ORAL CARE MOUTH RINSE: 15.0000 mL | OROMUCOSAL | Status: DC

## 2020-08-15 MED ORDER — PRIMIDONE 250 MG PO TABS
250.0000 mg | ORAL_TABLET | Freq: Three times a day (TID) | ORAL | Status: DC
  Administered 2020-08-15: 250 mg
  Filled 2020-08-15 (×3): qty 1

## 2020-08-15 MED ORDER — SENNOSIDES 8.8 MG/5ML PO SYRP: 5.0000 mL | ORAL_SOLUTION | Freq: Every evening | ORAL | Status: DC | PRN

## 2020-08-15 MED ORDER — MAGNESIUM SULFATE 2 GM/50ML IV SOLN
2.0000 g | Freq: Once | INTRAVENOUS | Status: AC
  Administered 2020-08-15: 2 g via INTRAVENOUS
  Filled 2020-08-15: qty 50

## 2020-08-15 MED ORDER — PRIMIDONE 250 MG PO TABS
250.0000 mg | ORAL_TABLET | Freq: Two times a day (BID) | ORAL | Status: DC
  Administered 2020-08-15 – 2020-08-19 (×8): 250 mg
  Filled 2020-08-15 (×8): qty 1

## 2020-08-15 MED ORDER — ORAL CARE MOUTH RINSE
15.0000 mL | OROMUCOSAL | Status: DC
  Administered 2020-08-15 – 2020-08-19 (×46): 15 mL via OROMUCOSAL

## 2020-08-15 MED ORDER — LEVOTHYROXINE SODIUM 88 MCG PO TABS
88.0000 ug | ORAL_TABLET | Freq: Every morning | ORAL | Status: DC
  Administered 2020-08-16 – 2020-08-19 (×4): 88 ug
  Filled 2020-08-15 (×4): qty 1

## 2020-08-15 MED ORDER — SENNOSIDES 8.8 MG/5ML PO SYRP
5.0000 mL | ORAL_SOLUTION | Freq: Every evening | ORAL | Status: DC | PRN
  Administered 2020-08-17: 5 mL
  Filled 2020-08-15: qty 5

## 2020-08-15 MED ORDER — "THROMBI-PAD 3""X3"" EX PADS"
1.0000 | MEDICATED_PAD | Freq: Once | CUTANEOUS | Status: DC
  Filled 2020-08-15: qty 1

## 2020-08-15 MED ORDER — FENTANYL CITRATE (PF) 100 MCG/2ML IJ SOLN
25.0000 ug | INTRAMUSCULAR | Status: DC | PRN
  Administered 2020-08-15 (×3): 100 ug via INTRAVENOUS
  Administered 2020-08-15: 50 ug via INTRAVENOUS
  Administered 2020-08-15 – 2020-08-17 (×7): 100 ug via INTRAVENOUS
  Administered 2020-08-17 (×2): 50 ug via INTRAVENOUS
  Administered 2020-08-17: 100 ug via INTRAVENOUS
  Administered 2020-08-17: 50 ug via INTRAVENOUS
  Administered 2020-08-17 – 2020-08-18 (×6): 100 ug via INTRAVENOUS
  Filled 2020-08-15 (×21): qty 2

## 2020-08-15 MED ORDER — NOREPINEPHRINE 4 MG/250ML-% IV SOLN
0.0000 ug/min | INTRAVENOUS | Status: DC
  Administered 2020-08-15: 4 ug/min via INTRAVENOUS
  Administered 2020-08-17: 1 ug/min via INTRAVENOUS
  Filled 2020-08-15: qty 250

## 2020-08-15 MED ORDER — POTASSIUM CHLORIDE 20 MEQ PO PACK
40.0000 meq | PACK | ORAL | Status: AC
  Administered 2020-08-15 (×3): 40 meq
  Filled 2020-08-15 (×3): qty 2

## 2020-08-15 MED ORDER — NOREPINEPHRINE 4 MG/250ML-% IV SOLN
2.0000 ug/min | INTRAVENOUS | Status: DC
  Administered 2020-08-15: 2 ug/min via INTRAVENOUS
  Filled 2020-08-15: qty 250

## 2020-08-15 MED ORDER — ATORVASTATIN CALCIUM 10 MG PO TABS
20.0000 mg | ORAL_TABLET | Freq: Every day | ORAL | Status: DC
  Administered 2020-08-15 – 2020-08-19 (×5): 20 mg
  Filled 2020-08-15 (×5): qty 2

## 2020-08-15 MED ORDER — CHLORHEXIDINE GLUCONATE CLOTH 2 % EX PADS
6.0000 | MEDICATED_PAD | Freq: Every day | CUTANEOUS | Status: DC
  Administered 2020-08-15 – 2020-08-22 (×10): 6 via TOPICAL

## 2020-08-15 MED ORDER — SODIUM CHLORIDE 0.9 % IV SOLN
INTRAVENOUS | Status: DC | PRN
  Administered 2020-08-15: 500 mL via INTRAVENOUS
  Administered 2020-08-19 – 2020-08-20 (×2): 250 mL via INTRAVENOUS

## 2020-08-15 MED ORDER — METOPROLOL TARTRATE 50 MG PO TABS: 50.0000 mg | ORAL_TABLET | Freq: Two times a day (BID) | ORAL | Status: DC

## 2020-08-15 MED ORDER — FUROSEMIDE 10 MG/ML IJ SOLN
INTRAMUSCULAR | Status: AC
  Filled 2020-08-15: qty 4

## 2020-08-15 MED ORDER — PROSOURCE TF PO LIQD
45.0000 mL | Freq: Two times a day (BID) | ORAL | Status: DC
  Filled 2020-08-15: qty 45

## 2020-08-15 MED ORDER — DOCUSATE SODIUM 50 MG/5ML PO LIQD
100.0000 mg | Freq: Every day | ORAL | Status: DC | PRN
  Administered 2020-08-17: 100 mg
  Filled 2020-08-15: qty 10

## 2020-08-15 MED ORDER — APIXABAN 5 MG PO TABS
5.0000 mg | ORAL_TABLET | Freq: Two times a day (BID) | ORAL | Status: DC
  Administered 2020-08-15: 5 mg
  Filled 2020-08-15: qty 1

## 2020-08-15 MED ORDER — CHLORHEXIDINE GLUCONATE 0.12% ORAL RINSE (MEDLINE KIT): 15.0000 mL | Freq: Two times a day (BID) | OROMUCOSAL | Status: DC

## 2020-08-15 MED ORDER — PANTOPRAZOLE SODIUM 40 MG PO PACK
40.0000 mg | PACK | ORAL | Status: DC
  Administered 2020-08-15 – 2020-08-19 (×5): 40 mg
  Filled 2020-08-15 (×5): qty 20

## 2020-08-15 MED ORDER — POLYETHYLENE GLYCOL 3350 17 G PO PACK: 17.0000 g | PACK | Freq: Every day | ORAL | Status: DC | PRN

## 2020-08-15 MED ORDER — IPRATROPIUM BROMIDE 0.02 % IN SOLN: 0.5000 mg | Freq: Two times a day (BID) | RESPIRATORY_TRACT | Status: DC

## 2020-08-15 MED ORDER — DIGOXIN 125 MCG PO TABS
0.1250 mg | ORAL_TABLET | Freq: Every day | ORAL | Status: DC
  Administered 2020-08-15 – 2020-08-19 (×5): 0.125 mg
  Filled 2020-08-15 (×5): qty 1

## 2020-08-15 NOTE — Progress Notes (Signed)
PT Cancellation Note  Patient Details Name: Katherine Ewing MRN: [id number] DOB: 07-17-1930   Cancelled Treatment:    Reason Eval/Treat Not Completed: Medical issues which prohibited therapy. Pt transferred to ICU and intubated. Noted d/c of PT orders by MD. PT signing off.   Ilda Foil 08/15/2020, 7:59 AM   Aida Raider, PT  Office # [phone] Pager [phone]

## 2020-08-15 NOTE — Procedures (Signed)
Intubation Procedure Note  Katherine Ewing  [id number]  02-17-1931  Date:08/15/20  Time:7:51 AM   Provider Performing:Maddock Finigan    Procedure: Intubation (96728)  Indication(s) Respiratory Failure  Consent Unable to obtain consent due to emergent nature of procedure.   Anesthesia Fentanyl   Time Out Verified patient identification, verified procedure, site/side was marked, verified correct patient position, special equipment/implants available, medications/allergies/relevant history reviewed, required imaging and test results available.   Sterile Technique Usual hand hygeine, masks, and gloves were used   Procedure Description Patient positioned in bed supine.  Sedation given as noted above.  Patient was intubated with endotracheal tube using #3 MAC Laryngoscope.  View was Grade 2 only posterior commissure .  Number of attempts was 1.  Colorimetric CO2 detector was consistent with tracheal placement.   Complications/Tolerance None; patient tolerated the procedure well. Chest X-ray is ordered to verify placement.  Katherine Mires, MD [place] Pager - [phone] 08/15/2020, 7:51 AM

## 2020-08-15 NOTE — Progress Notes (Signed)
PROGRESS NOTE    Katherine Ewing  CEY:[id number] DOB: July 10, 1930 DOA: 08/07/2020 PCP: Patrecia Pour, Christean Grief, MD   Brief Narrative:  87 y.o. year-old w/ hx of HTN, MV prolapse, PAF on eliquis, chronic syst CHF (last EF 45-50% 05/2019), hypothyroidism, thrombocytopenia presented to ED w/ 3d hx of feeling tired, no appetite, hot and cold w/ cough. +myalgias.  She was admitted with concerns of community-acquired pneumonia/aspiration pneumonia with atrial fibrillation with RVR.  Due to penicillin allergy she was started on Rocephin, Flagyl and azithromycin along with bronchodilators.  Initially required Cardizem drip and cardiology team was consulted.  Her Eliquis was resumed.  Overnight and morning of 3/18 she became obtunded with severe respiratory distress requiring intubation and transferred to the ICU.   Assessment & Plan:   Principal Problem:   Community acquired pneumonia Active Problems:   Chronic systolic CHF (congestive heart failure) ([place])   Essential hypertension   Atrial fibrillation with RVR (HCC)   Hypotension   Dehydration, moderate   CAP (community acquired pneumonia)  Acute hypoxic respiratory failure -Combination of worsening pneumonia, volume overload.  She was on nonrebreather continued to saturate in high 70s and low 80s with altered mental status.  Critical care team consulted and patient was intubated.  Acute on chronic congestive heart failure with reduced, EF 40-45%.  Class IV -This morning patient showed signs of volume overload therefore fluids were stopped.  Lasix and ARB is on hold due to soft blood pressure but can be resumed as appropriate.  Lasix 40 mg IV and postintubation chest x-ray ordered.  Critical care team will follow this up and adjust medications and diuretics as appropriate   Right upper lobe pneumonia-suspect community-acquired pneumonia versus aspiration pneumonia -Antibiotics-Rocephin, Flagyl and azithromycin -Speech and swallow-mild aspiration  risk. -Bronchodilators, incentive spirometer, flutter valve -Out of bed to chair -Supplemental oxygen as necessary  Atrial fibrillation with RVR -Likely from underlying pulmonary pathology.  Will need to aggressively replete electrolytes as appropriate.  Continue metoprolol 50 mg twice daily and uptitrate as necessary.  On Cardizem drip monitor blood pressure.  Cardiology team following.  Continue Eliquis.  Digoxin started  Essential hypertension -P.o. metoprolol, Cardizem drip.  P.o. Cardizem on hold  Hypothyroidism -Synthroid    DVT prophylaxis: Eliquis Code Status: Full code Family Communication: Met with patient's husband at bedside  Status is: Inpatient  Remains inpatient appropriate because:Inpatient level of care appropriate due to severity of illness   Dispo: The patient is from: Home              Anticipated d/c is to: Home              Patient currently is not medically stable to d/c.  Patient has been in severe respiratory distress requiring intubation and transferred to the ICU   Difficult to place patient No       Body mass index is 26.81 kg/m.       Subjective: Received a page this morning stating rapid response was called.  Patient was noted to be obtunded and hypoxic on nonrebreather.  Her maximum saturations were mid 80s and continued to drop therefore critical care team was notified and patient was intubated.  Postintubation her saturations improved to 90s.  Husband was present at bedside was updated as well    Examination:  Constitutional: In respiratory distress requiring intubation.  Appears critically ill Respiratory: Bilateral diffuse rhonchi Cardiovascular: Normal sinus rhythm, no rubs Abdomen: Nontender nondistended good bowel sounds Musculoskeletal: No edema noted Skin: No  rashes seen Neurologic: Unable to assess obtunded Psychiatric: Unable to assess  Objective: Vitals:   08/14/20 1900 08/14/20 2015 08/14/20 2330 08/15/20 0357   BP: 129/87  136/84 140/85  Pulse: (!) 129  (!) 141 (!) 110  Resp: (!) 38  (!) 27 (!) 26  Temp:   98.1 F (36.7 C) 97.8 F (36.6 C)  TempSrc:   Oral Axillary  SpO2: 96% 94% 93% 94%  Weight:      Height:       No intake or output data in the 24 hours ending 08/15/20 0743 Filed Weights   08/25/2020 1150 08/02/2020 1802  Weight: 61 kg 66.5 kg     Data Reviewed:   CBC: Recent Labs  Lab 08/03/2020 1204 08/14/20 0317 08/15/20 0327  WBC 12.3* 10.7* 10.3  NEUTROABS 11.1*  --   --   HGB 10.5* 9.5* 9.9*  HCT 32.4* 28.5* 28.6*  MCV 96.1 93.8 92.9  PLT 136* 116* 863*   Basic Metabolic Panel: Recent Labs  Lab 08/09/2020 1204 08/15/20 0327  NA 135 136  K 3.3* 2.8*  CL 103 101  CO2 22 26  GLUCOSE 87 122*  BUN 18 12  CREATININE 1.05* 0.76  CALCIUM 8.6* 8.4*  MG  --  1.8   GFR: Estimated Creatinine Clearance: 44.3 mL/min (by C-G formula based on SCr of 0.76 mg/dL). Liver Function Tests: Recent Labs  Lab 08/18/2020 1204  AST 38  ALT 24  ALKPHOS 41  BILITOT 2.5*  PROT 6.2*  ALBUMIN 2.6*   No results for input(s): LIPASE, AMYLASE in the last 168 hours. No results for input(s): AMMONIA in the last 168 hours. Coagulation Profile: Recent Labs  Lab 08/24/2020 1204  INR 1.7*   Cardiac Enzymes: No results for input(s): CKTOTAL, CKMB, CKMBINDEX, TROPONINI in the last 168 hours. BNP (last 3 results) No results for input(s): PROBNP in the last 8760 hours. HbA1C: No results for input(s): HGBA1C in the last 72 hours. CBG: Recent Labs  Lab 08/24/2020 1156  GLUCAP 79   Lipid Profile: No results for input(s): CHOL, HDL, LDLCALC, TRIG, CHOLHDL, LDLDIRECT in the last 72 hours. Thyroid Function Tests: No results for input(s): TSH, T4TOTAL, FREET4, T3FREE, THYROIDAB in the last 72 hours. Anemia Panel: No results for input(s): VITAMINB12, FOLATE, FERRITIN, TIBC, IRON, RETICCTPCT in the last 72 hours. Sepsis Labs: Recent Labs  Lab 07/29/2020 1204 08/20/2020 1840 08/14/20 0820   PROCALCITON  --   --  0.25  LATICACIDVEN 1.2 1.1  --     Recent Results (from the past 240 hour(s))  Resp Panel by RT-PCR (Flu A&B, Covid) Nasopharyngeal Swab     Status: None   Collection Time: 08/12/2020 11:55 AM   Specimen: Nasopharyngeal Swab; Nasopharyngeal(NP) swabs in vial transport medium  Result Value Ref Range Status   SARS Coronavirus 2 by RT PCR NEGATIVE NEGATIVE Final    Comment: (NOTE) SARS-CoV-2 target nucleic acids are NOT DETECTED.  The SARS-CoV-2 RNA is generally detectable in upper respiratory specimens during the acute phase of infection. The lowest concentration of SARS-CoV-2 viral copies this assay can detect is 138 copies/mL. A negative result does not preclude SARS-Cov-2 infection and should not be used as the sole basis for treatment or other patient management decisions. A negative result may occur with  improper specimen collection/handling, submission of specimen other than nasopharyngeal swab, presence of viral mutation(s) within the areas targeted by this assay, and inadequate number of viral copies(<138 copies/mL). A negative result must be combined with clinical  observations, patient history, and epidemiological information. The expected result is Negative.  Fact Sheet for Patients:  EntrepreneurPulse.com.au  Fact Sheet for Healthcare Providers:  IncredibleEmployment.be  This test is no t yet approved or cleared by the [place] FDA and  has been authorized for detection and/or diagnosis of SARS-CoV-2 by FDA under an Emergency Use Authorization (EUA). This EUA will remain  in effect (meaning this test can be used) for the duration of the COVID-19 declaration under Section 564(b)(1) of the Act, 21 U.S.C.section 360bbb-3(b)(1), unless the authorization is terminated  or revoked sooner.       Influenza A by PCR NEGATIVE NEGATIVE Final   Influenza B by PCR NEGATIVE NEGATIVE Final    Comment: (NOTE) The  Xpert Xpress SARS-CoV-2/FLU/RSV plus assay is intended as an aid in the diagnosis of influenza from Nasopharyngeal swab specimens and should not be used as a sole basis for treatment. Nasal washings and aspirates are unacceptable for Xpert Xpress SARS-CoV-2/FLU/RSV testing.  Fact Sheet for Patients: EntrepreneurPulse.com.au  Fact Sheet for Healthcare Providers: IncredibleEmployment.be  This test is not yet approved or cleared by the [place] FDA and has been authorized for detection and/or diagnosis of SARS-CoV-2 by FDA under an Emergency Use Authorization (EUA). This EUA will remain in effect (meaning this test can be used) for the duration of the COVID-19 declaration under Section 564(b)(1) of the Act, 21 U.S.C. section 360bbb-3(b)(1), unless the authorization is terminated or revoked.  Performed at [facility], [place] [address] [place], [place] [address]   Blood Culture (routine x 2)     Status: None (Preliminary result)   Collection Time: 08/08/2020 12:04 PM   Specimen: BLOOD  Result Value Ref Range Status   Specimen Description BLOOD RIGHT ANTECUBITAL  Final   Special Requests   Final    BOTTLES DRAWN AEROBIC AND ANAEROBIC Blood Culture adequate volume   Culture   Final    NO GROWTH 1 DAY Performed at [facility], [place] [address] [place], [place] [address]    Report Status PENDING  Incomplete  Blood Culture (routine x 2)     Status: None (Preliminary result)   Collection Time: 08/10/2020 12:31 PM   Specimen: BLOOD  Result Value Ref Range Status   Specimen Description BLOOD SITE NOT SPECIFIED  Final   Special Requests   Final    BOTTLES DRAWN AEROBIC AND ANAEROBIC Blood Culture results may not be optimal due to an excessive volume of blood received in culture bottles   Culture   Final    NO GROWTH 1 DAY Performed at [facility], [place] [address], [place], [place] [address]    Report Status PENDING  Incomplete          Radiology Studies: DG Chest Port 1 View  Result Date: 08/14/2020 CLINICAL DATA:  Dyspnea. EXAM: PORTABLE CHEST 1 VIEW COMPARISON:  Chest x-ray from yesterday. FINDINGS: Unchanged mild cardiomegaly. Mildly improved right upper lobe consolidation. No pneumothorax or pleural effusion. No acute osseous abnormality. IMPRESSION: 1. Mildly improved right upper lobe pneumonia. Electronically Signed   By: Titus Dubin M.D.   On: 08/14/2020 17:00   DG Chest Port 1 View  Result Date: 08/04/2020 CLINICAL DATA:  Cough for 2 weeks, fever, atrial fibrillation, question sepsis EXAM: PORTABLE CHEST 1 VIEW COMPARISON:  Portable exam 1224 hours compared to 06/22/2020 FINDINGS: Enlargement of cardiac silhouette. Mediastinal contours and pulmonary vascularity normal. Atherosclerotic calcification aorta. New RIGHT upper lobe infiltrate consistent with pneumonia.  Remaining lungs clear. No pleural effusion or pneumothorax. Bones demineralized with dextroconvex thoracolumbar scoliosis. IMPRESSION: Enlargement of cardiac silhouette. New RIGHT upper lobe infiltrate consistent with pneumonia. Aortic Atherosclerosis (ICD10-I70.0). Electronically Signed   By: Lavonia Dana M.D.   On: 08/03/2020 12:34        Scheduled Meds: . furosemide      . apixaban  5 mg Oral BID  . atorvastatin  20 mg Oral Daily  . digoxin  0.125 mg Oral Daily  . dorzolamide-timolol  1 drop Both Eyes BID  . ipratropium  0.5 mg Nebulization BID  . latanoprost  1 drop Both Eyes QHS  . levalbuterol  0.63 mg Nebulization BID  . levothyroxine  88 mcg Oral q morning  . metoprolol tartrate  50 mg Oral BID  . primidone  250 mg Oral TID   Continuous Infusions: . azithromycin 500 mg (08/14/20 1333)  . cefTRIAXone (ROCEPHIN)  IV    . diltiazem (CARDIZEM) infusion 15 mg/hr (08/15/20 0342)  . lactated ringers 75 mL/hr at 08/14/20 1541  . metronidazole 500 mg (08/15/20 0340)     LOS: 2 days   Time spent= 35 mins    Reverie Vaquera Arsenio Loader,  MD [facility]  If 7PM-7AM, please contact night-coverage  08/15/2020, 7:43 AM

## 2020-08-15 NOTE — Progress Notes (Signed)
HR improved, but now hypotensive.  Will d/c cardizem gtt and lopressor.  Hold precedex gtt for now.  Will give 1 liter NS fluid bolus.  Coralyn Helling, MD [facility] Pulmonary/Critical Care Pager - [phone] 08/15/2020, 9:08 AM

## 2020-08-15 NOTE — Progress Notes (Signed)
Initial Nutrition Assessment  RD working remotely.  DOCUMENTATION CODES:   Not applicable  INTERVENTION:   Initiate tube feeding via OG tube: - Vital AF 1.2 @ 45 ml/hr (1080 ml/day)  Tube feeding regimen provides 1296 kcal, 81 grams of protein, and 876 ml of H2O.   NUTRITION DIAGNOSIS:   Inadequate oral intake related to inability to eat as evidenced by NPO status.  GOAL:   Patient will meet greater than or equal to 90% of their needs  MONITOR:   Vent status,Labs,Weight trends,TF tolerance  REASON FOR ASSESSMENT:   Ventilator,Consult Enteral/tube feeding initiation and management  ASSESSMENT:   87 year old female who presented to the ED on 3/16 with cough and poor PO intake. PMH of HTN, MV prolapse, PAF, CHF, hypothyroidism, thrombocytopenia. Pt admitted with CAP, atrial fibrillation.   3/18 - intubated, transferred to ICU  RD consulted for tube feeding intiation and management. OG tube in place with side port at the level of the gastric fundus per x-ray reading.  Unable to obtain diet and weight history at this time. Reviewed weight history in chart. Weight appears stable over the last year with fluctuations between 59-61 kg. Current weight of 66.5 kg is documented as being "stated."  Patient is currently intubated on ventilator support MV: 10.0 L/min Temp (24hrs), Avg:97.9 F (36.6 C), Min:97.5 F (36.4 C), Max:98.1 F (36.7 C) BP (cuff): 81/61 MAP (cuff): 69  Drips: Precedex NS  Medications reviewed and include: protonix, klor-con 40 mEq x 3 doses, IV abx  Labs reviewed: potassium 2.8, hemoglobin 9.9 CBG's: 138  NUTRITION - FOCUSED PHYSICAL EXAM:  Unable to complete at this time. RD working remotely.  Diet Order:   Diet Order            Diet NPO time specified  Diet effective now                 EDUCATION NEEDS:   No education needs have been identified at this time  Skin:  Skin Assessment: Reviewed RN Assessment  Last BM:  September 12, 2020  large type 7  Height:   Ht Readings from Last 1 Encounters:  September 12, 2020 5\' 2"  (1.575 m)    Weight:   Wt Readings from Last 1 Encounters:  09/12/2020 66.5 kg    BMI:  Body mass index is 26.81 kg/m.  Estimated Nutritional Needs:   Kcal:  1300  Protein:  70-85 grams  Fluid:  >/= 1.5 L    08/15/20, MS, RD, LDN Inpatient Clinical Dietitian Please see AMiON for contact information.

## 2020-08-15 NOTE — Procedures (Signed)
Central Venous Catheter Insertion Procedure Note  Katherine Ewing  [id number]  Jan 26, 1931  Date:08/15/20  Time:5:40 PM   Provider Performing:Crislyn Willbanks   Procedure: Insertion of Non-tunneled Central Venous [phone]) with US guidance ([id number])   Indication(s) Difficult access  Consent Risks of the procedure as well as the alternatives and risks of each were explained to the patient and/or caregiver.  Consent for the procedure was obtained and is signed in the bedside chart  Anesthesia Topical only with 1% lidocaine   Timeout Verified patient identification, verified procedure, site/side was marked, verified correct patient position, special equipment/implants available, medications/allergies/relevant history reviewed, required imaging and test results available.  Sterile Technique Maximal sterile technique including full sterile barrier drape, hand hygiene, sterile gown, sterile gloves, mask, hair covering, sterile ultrasound probe cover (if used).  Procedure Description Area of catheter insertion was cleaned with chlorhexidine and draped in sterile fashion.  With real-time ultrasound guidance a central venous catheter was placed into the left internal jugular vein. Nonpulsatile blood flow and easy flushing noted in all ports.  The catheter was sutured in place and sterile dressing applied.  Complications/Tolerance None; patient tolerated the procedure well. Chest X-ray is ordered to verify placement for internal jugular or subclavian cannulation.   Chest x-ray is not ordered for femoral cannulation.  EBL Minimal  Coralyn Helling, MD [facility] Pulmonary/Critical Care Pager - [phone] 08/15/2020, 5:40 PM

## 2020-08-15 NOTE — Progress Notes (Signed)
Patient struggling to maintain saturations this morning, requiring non-rebreather,lung sounds wet. Rapid response called, RT, and attending is at bedside.

## 2020-08-15 NOTE — Progress Notes (Addendum)
Progress Note  Patient Name: Katherine Ewing Date of Encounter: 08/15/2020  [facility] Cardiologist: Dr. Owens Shark  Subjective   Patient deteriorated overnight, she was found unresponsive and required intubation, currently hypotensive Cardizem drip was held.  Inpatient Medications    Scheduled Meds: . apixaban  5 mg Per Tube BID  . atorvastatin  20 mg Per Tube Daily  . chlorhexidine gluconate (MEDLINE KIT)  15 mL Mouth Rinse BID  . Chlorhexidine Gluconate Cloth  6 each Topical Daily  . digoxin  0.125 mg Per Tube Daily  . dorzolamide-timolol  1 drop Both Eyes BID  . feeding supplement (PROSource TF)  45 mL Per Tube BID  . feeding supplement (VITAL HIGH PROTEIN)  1,000 mL Per Tube Q24H  . furosemide      . latanoprost  1 drop Both Eyes QHS  . [START ON 08/16/2020] levothyroxine  88 mcg Per Tube q morning  . mouth rinse  15 mL Mouth Rinse 10 times per day  . mouth rinse  15 mL Mouth Rinse 10 times per day  . pantoprazole sodium  40 mg Per Tube Q24H  . potassium chloride  40 mEq Per Tube Q4H  . primidone  250 mg Oral TID   Continuous Infusions: . sodium chloride 500 mL (08/15/20 0834)  . azithromycin 500 mg (08/14/20 1333)  . cefTRIAXone (ROCEPHIN)  IV    . dexmedetomidine (PRECEDEX) IV infusion 0.4 mcg/kg/hr (08/15/20 0820)   PRN Meds: sodium chloride, acetaminophen, docusate, fentaNYL (SUBLIMAZE) injection, midazolam, polyethylene glycol, senna-docusate   Vital Signs    Vitals:   08/15/20 0745 08/15/20 0750 08/15/20 0755 08/15/20 0759  BP: 108/89  (!) 134/92 126/77  Pulse: (!) 112 (!) 148 (!) 128   Resp: 17 (!) 36 (!) 28 (!) 26  Temp:    98 F (36.7 C)  TempSrc:    Axillary  SpO2: 95% 93% 94%   Weight:      Height:       No intake or output data in the 24 hours ending 08/15/20 1007 Last 3 Weights 08/12/2020 08/03/2020 06/22/2020  Weight (lbs) 146 lb 9.7 oz 134 lb 7.7 oz 134 lb 7.7 oz  Weight (kg) 66.5 kg 61 kg 61 kg      Telemetry    Atrial fibrillation  with ventricular rates in 90s, earlier this morning 130 bpm- Personally Reviewed  ECG    No new tracing - Personally Reviewed  Physical Exam  Intubated, sedated Neck: No JVD Cardiac: iRRR, no murmurs, rubs, or gallops.  Respiratory: Clear to auscultation bilaterally. GI: Soft, nontender, non-distended  MS: No edema; No deformity.  Labs    High Sensitivity Troponin:  No results for input(s): TROPONINIHS in the last 720 hours.    Chemistry Recent Labs  Lab 08/10/2020 1204 08/15/20 0327  NA 135 136  K 3.3* 2.8*  CL 103 101  CO2 22 26  GLUCOSE 87 122*  BUN 18 12  CREATININE 1.05* 0.76  CALCIUM 8.6* 8.4*  PROT 6.2*  --   ALBUMIN 2.6*  --   AST 38  --   ALT 24  --   ALKPHOS 41  --   BILITOT 2.5*  --   GFRNONAA 51* >60  ANIONGAP 10 9     Hematology Recent Labs  Lab 08/02/2020 1204 08/14/20 0317 08/15/20 0327  WBC 12.3* 10.7* 10.3  RBC 3.37* 3.04* 3.08*  HGB 10.5* 9.5* 9.9*  HCT 32.4* 28.5* 28.6*  MCV 96.1 93.8 92.9  MCH 31.2 31.3  32.1  MCHC 32.4 33.3 34.6  RDW 14.8 15.0 14.9  PLT 136* 116* 149*    BNP Recent Labs  Lab 08/14/20 0820  BNP 272.4*     DDimer No results for input(s): DDIMER in the last 168 hours.   Radiology    DG CHEST PORT 1 VIEW  Result Date: 08/15/2020 CLINICAL DATA:  87 year old female with shortness of breath. Intubated. EXAM: PORTABLE CHEST 1 VIEW COMPARISON:  Portable chest 08/14/2020 and earlier. FINDINGS: Portable AP semi upright view at 0805 hours. Endotracheal tube tip in good position between the level the clavicles and carina. Enteric tube placed into the stomach, side hole at the level of the gastric fundus. Larger lung volumes. Partial right upper lobe consolidation has not significantly changed. But there is evidence of increased lower lobe collapse or consolidation, now obscuring the left hemidiaphragm. Stable cardiac size and mediastinal contours. No pneumothorax or pulmonary edema. Evidence of small right pleural effusion. No  acute osseous abnormality identified. IMPRESSION: 1. Endotracheal tube and enteric tube in good position. 2. Larger lung volumes with stable right upper lobe pneumonia but increased bilateral lower lobe collapse or consolidation. Small right pleural effusion suspected. Electronically Signed   By: Genevie Ann M.D.   On: 08/15/2020 08:33   DG Chest Port 1 View  Result Date: 08/14/2020 CLINICAL DATA:  Dyspnea. EXAM: PORTABLE CHEST 1 VIEW COMPARISON:  Chest x-ray from yesterday. FINDINGS: Unchanged mild cardiomegaly. Mildly improved right upper lobe consolidation. No pneumothorax or pleural effusion. No acute osseous abnormality. IMPRESSION: 1. Mildly improved right upper lobe pneumonia. Electronically Signed   By: Titus Dubin M.D.   On: 08/14/2020 17:00   DG Chest Port 1 View  Result Date: 08/26/2020 CLINICAL DATA:  Cough for 2 weeks, fever, atrial fibrillation, question sepsis EXAM: PORTABLE CHEST 1 VIEW COMPARISON:  Portable exam 1224 hours compared to 06/22/2020 FINDINGS: Enlargement of cardiac silhouette. Mediastinal contours and pulmonary vascularity normal. Atherosclerotic calcification aorta. New RIGHT upper lobe infiltrate consistent with pneumonia. Remaining lungs clear. No pleural effusion or pneumothorax. Bones demineralized with dextroconvex thoracolumbar scoliosis. IMPRESSION: Enlargement of cardiac silhouette. New RIGHT upper lobe infiltrate consistent with pneumonia. Aortic Atherosclerosis (ICD10-I70.0). Electronically Signed   By: Lavonia Dana M.D.   On: 08/28/2020 12:34   Cardiac Studies     Patient Profile     87 y.o.femalewith a historyof persistent atrial fibrillation/atrial flutteron Eliquis, previously failed cardioversion, not a candidate for ablation, tachy-mediated cardiomyopathy with EF as low as 30-35% but improved to 45-50% on Echo in 05/2019, prior PE/DVT on chronic anticoagulation, GI bleed felt to be secondary to hemorrhoids, colonoscopy secondary to GI bleed in  September 2021 and per GI okay to resume Eliquis, thrombocytopenia, hypertension, hypothyroidism, glaucoma, severe dementia.  Patient was admitted on June 22, 2020 with symptomatic A. fib with RVR with palpitations and hypotension, it was believed that her rapid heart rate was secondary to noncompliance at home, her heart rate improved with restarting of her home Cardizem.    Patient again admitted with palpitations, fatigue, no appetite, hot and cold w/ cough. +myalgias. No dysuria, no CP or abd pain, no n/v/d. No SOB. +palpitations. On EMS arrival HR was high 200-220, and BP's low. She rec'd 500 cc bolus and 47m bolus Cardizem IV w/ 5 mg / hr drip. She remains on cardizem drip and her ventricular rates remains in 120-140'. Pt rx'd in ED w/ IV abx for PNA. COVID is negative.  The patient is a poor historian, only oriented to herself.  Assessment & Plan    Acute respiratory failure with hypoxia -Status post intubation earlier this morning -The patient hypotensive, Cardizem was held currently receiving 1 L bolus if she does not respond well she will require Levophed drip.  Chronic Atrial Fibrillation now with RVR in the settings of pneumonia -Cardizem and metoprolol held because of hypotension, heart rates currently 90s if she goes to RVR again she will required amiodarone drip. -Continue home Eliquis 51m twice daily.  Tachymediated Cardiomyopathy - [facility]  - Appears euvolemic on exam. - Continue home medications: Losartan and spironolactone are being held as she was septic. - Will continue to monitor volume status.   Hypertension -Blood pressure rather soft secondary to pneumonia and sepsis.  Prior PE/DVT - On chronic anticoagulation with Eliquis.  With underlying dementia, overall prognosis is poor.  We will follow.  For questions or updates, please contact [email or web address]udsonPlease consult [email or web address] for contact  info under      Signed, KEna Dawley MD  08/15/2020, 10:07 AM

## 2020-08-15 NOTE — Significant Event (Signed)
Rapid Response Event Note   Reason for Call :  Acute respiratory distress Prior to event pt had acute change in supplemental oxygen needs. Around 0500 she was on 2LNC, SpO2 dropped to the 80s and she was placed on 100% NRB.   Initial Focused Assessment:  Respiratory distress, accessory muscle use. She provides minimal vocal response, orientation is unclear. Audible upper airway rhonchus. Lung sounds are rhonchus throughout. Tachypneic, breathing pattern is non-sustainable. Initially oxygen saturation sustaining 85-88% on 100% NRB. PERRLA, 70mm. Skin is pale, warm, diaphoretic. Abdomen is round, distended.   VS: BP 140/89, HR 137, RR 33, SpO2 86% on 100% NRB  Interventions:  Primary MD notified 40mg  IV Lasix given PCCM contacted- pt intubated IV Fentanyl following intubation  Plan of Care:  -Transferred to ICU  Event Summary:  MD Notified: Dr. Call Time:  Arrival Time: 0710 End Time: 0800  0801, RN

## 2020-08-15 NOTE — Consult Note (Signed)
NAME:  Katherine Ewing, MRN:  [id number], DOB:  1931-03-11, LOS: 2 ADMISSION DATE:  08/25/20, CONSULTATION DATE:  08/15/20 REFERRING MD:  Nelson Chimes CHIEF COMPLAINT:  SOB   History of Present Illness:  Katherine Ewing is a 87 y.o. female who was admitted 3/16 with malaise, myalgias, cough, decreased appetite.  She was found to have AFRVR and hypotension. BP improved with 500cc fluid bolus and HR improved after 20mg  Cardizem bolus followed by infusion.  CXR showed RUL PNA, COVID negative.  She was admitted by [facility] and started on treatment for PNA and A.fib RVR.  Cardiology was consulted 3/17 and recommended continuing Cardizem and Metoprolol and adding Digoxin..  Early AM 3/18, she had deterioration to the point of respiratory distress, hypoxia, and obtundation.  Rapid response was called and pt was bagged while awaiting PCCM arrival.  Upon our arrival, she was intubated and was transferred to the ICU for further management.  Pertinent  Medical History  has BRBPR (bright red blood per rectum); Chronic systolic CHF (congestive heart failure) (HCC); Essential hypertension; Hyperlipidemia LDL goal <160; Hypothyroidism; Paroxysmal atrial fibrillation (HCC); Atrial fibrillation with RVR (HCC); Community acquired pneumonia; Hypotension; Dehydration, moderate; CAP (community acquired pneumonia); and Sepsis without acute organ dysfunction (HCC) on their problem list.  Significant Hospital Events: Including procedures, antibiotic start and stop dates in addition to other pertinent events   3/16 admit. 3/17 PT eval > home health PT recommended. 3/17 SLP eval > regular diet.  Antibiotics: Ceftriaxone 3/16 >  Azithromycin 3/16 >  Flagyl 3/16 >   Interim History / Subjective:  Just intubated.  Objective   Blood pressure 126/77, pulse (!) 110, temperature 98 F (36.7 C), temperature source Axillary, resp. rate (!) 26, height 5\' 2"  (1.575 m), weight 66.5 kg, SpO2 94 %.       No intake or output data in the 24  hours ending 08/15/20 0805 Filed Weights   2020/08/25 1150 2020-08-25 1802  Weight: 61 kg 66.5 kg    Examination: General: Elderly female, frail, just intubated. Neuro: Sedated, not responsive. HEENT: [place]/AT. Sclerae anicteric. ETT in place. Cardiovascular: IRIR, tachy, no M/R/G.  Lungs: Respirations even and unlabored.  Coarse bilaterally. Abdomen: BS x 4, soft, NT/ND.  Musculoskeletal: No gross deformities, no edema.  Skin: Intact, warm, no rashes.  Labs/imaging personally reviewed   CXR 3/18 > RUL PNA, mild edema.  Resolved Hospital Problem list     Assessment & Plan:   Acute hypoxic respiratory failure with presumed aspiration PNA.  S/p intubation AM 3/18. - Continue full vent support. - SBT when mental status allows. - Continue abx. - Send trach aspirate. - Follow cultures and CXR.  A.fib with RVR. Hx HTN, HLD, sCHF (echo from Dec 2020 with EF 45-50%). - Continue Cardizem, Digoxin, Metoprolol, Eliquis, Atorvastatin. - Cards following, appreciate the assistance. - Hold home Diltiazem, Furosemide, Losartan, Toprol-XL, Spironolactone.  Hypokalemia - being repleted. - Continue repletion. - Follow BMP.  Hx PE / DVT. - Continue home Eliquis.  Hx Hypothyroidism. - Continue home synthroid.  Best practice (evaluated daily)  Diet:  NPO Pain/Anxiety/Delirium protocol (if indicated): Yes (RASS goal -1) VAP protocol (if indicated): Yes DVT prophylaxis: Systemic AC GI prophylaxis: PPI Glucose control:  SSI No Central venous access:  N/A Arterial line:  N/A Foley:  N/A Mobility:  bed rest  PT consulted: Yes Last date of multidisciplinary goals of care discussion None Code Status:  full code Disposition: ICU  Labs   CBC: Recent Labs  Lab 08/25/20 1204  08/14/20 0317 08/15/20 0327  WBC 12.3* 10.7* 10.3  NEUTROABS 11.1*  --   --   HGB 10.5* 9.5* 9.9*  HCT 32.4* 28.5* 28.6*  MCV 96.1 93.8 92.9  PLT 136* 116* 149*    Basic Metabolic Panel: Recent Labs  Lab  August 23, 2020 1204 08/15/20 0327  NA 135 136  K 3.3* 2.8*  CL 103 101  CO2 22 26  GLUCOSE 87 122*  BUN 18 12  CREATININE 1.05* 0.76  CALCIUM 8.6* 8.4*  MG  --  1.8   GFR: Estimated Creatinine Clearance: 44.3 mL/min (by C-G formula based on SCr of 0.76 mg/dL). Recent Labs  Lab August 23, 2020 1204 08/28/2020 1840 08/14/20 0317 08/14/20 0820 08/15/20 0327  PROCALCITON  --   --   --  0.25  --   WBC 12.3*  --  10.7*  --  10.3  LATICACIDVEN 1.2 1.1  --   --   --     Liver Function Tests: Recent Labs  Lab 08/05/2020 1204  AST 38  ALT 24  ALKPHOS 41  BILITOT 2.5*  PROT 6.2*  ALBUMIN 2.6*   No results for input(s): LIPASE, AMYLASE in the last 168 hours. No results for input(s): AMMONIA in the last 168 hours.  ABG No results found for: PHART, PCO2ART, PO2ART, HCO3, TCO2, ACIDBASEDEF, O2SAT   Coagulation Profile: Recent Labs  Lab 08/17/2020 1204  INR 1.7*    Cardiac Enzymes: No results for input(s): CKTOTAL, CKMB, CKMBINDEX, TROPONINI in the last 168 hours.  HbA1C: No results found for: HGBA1C  CBG: Recent Labs  Lab 08/19/2020 1156  GLUCAP 79    Review of Systems:   Unable to obtain as pt is encephalopathic.  Past Medical History:  She,  has a past medical history of Hypertension and MVP (mitral valve prolapse).   Surgical History:   Past Surgical History:  Procedure Laterality Date  . ABDOMINAL HYSTERECTOMY    . APPENDECTOMY    . CHOLECYSTECTOMY    . COLONOSCOPY WITH PROPOFOL N/A 02/01/2020   Procedure: COLONOSCOPY WITH PROPOFOL;  Surgeon: Jeani Hawking, MD;  Location: [facility] ENDOSCOPY;  Service: Endoscopy;  Laterality: N/A;  . POLYPECTOMY  02/01/2020   Procedure: POLYPECTOMY;  Surgeon: Jeani Hawking, MD;  Location: [facility] ENDOSCOPY;  Service: Endoscopy;;     Social History:   reports that she has never smoked. She has never used smokeless tobacco. She reports that she does not drink alcohol and does not use drugs.   Family History:  Her family history includes Heart  disease in her mother.   Allergies Allergies  Allergen Reactions  . Penicillins Hives    Did it involve swelling of the face/tongue/throat, SOB, or low BP? Y Did it involve sudden or severe rash/hives, skin peeling, or any reaction on the inside of your mouth or nose? Y Did you need to seek medical attention at a hospital or doctor's office? N When did it last happen?Decades Ago If all above answers are "NO", may proceed with cephalosporin use.    . [place] Other (See Comments)    headache  . Adhesive  [Tape] Rash     Home Medications  Prior to Admission medications   Medication Sig Start Date End Date Taking? Authorizing Provider  apixaban (ELIQUIS) 5 MG TABS tablet Take 5 mg by mouth 2 (two) times daily.  06/12/19  Yes [provider]  atorvastatin (LIPITOR) 20 MG tablet Take 20 mg by mouth daily. 01/04/20  Yes [provider]  Cyanocobalamin (VITAMIN B-12 PO)  Take 1 tablet by mouth daily.   Yes [provider]  diltiazem (CARDIZEM CD) 300 MG 24 hr capsule Take 300 mg by mouth daily. 11/14/19  Yes [provider]  dorzolamide-timolol (COSOPT) 22.3-6.8 MG/ML ophthalmic solution Place 1 drop into both eyes 2 (two) times daily.  12/18/18  Yes [provider]  furosemide (LASIX) 40 MG tablet Take 20 mg by mouth daily. 04/23/20  Yes [provider]  latanoprost (XALATAN) 0.005 % ophthalmic solution Place 1 drop into both eyes at bedtime.  09/06/18  Yes [provider]  levothyroxine (SYNTHROID) 88 MCG tablet Take 88 mcg by mouth every morning. 08/01/20  Yes [provider]  losartan (COZAAR) 25 MG tablet Take 25 mg by mouth daily. 12/05/19  Yes [provider]  metoprolol succinate (TOPROL-XL) 100 MG 24 hr tablet Take 150 mg by mouth daily.  06/12/19  Yes [provider]  polyethylene glycol (MIRALAX / GLYCOLAX) 17 g packet Take 17 g by mouth daily as needed for mild constipation.   Yes [provider]  primidone (MYSOLINE) 250 MG tablet Take 250 mg by mouth 3 (three) times daily. 03/19/19  Yes [provider]  spironolactone (ALDACTONE) 25 MG tablet Take 25 mg by mouth daily.  04/24/19  Yes [provider]  traMADol (ULTRAM) 50 MG tablet Take 50 mg by mouth daily as needed for moderate pain. 06/18/20  Yes [provider]     Critical care time: 35 min.   Rutherford Guys, PA - [facility] For pager details, please see AMION If no response to pager, please call [phone] until 7:00 PM After 7:00 PM, please call Elink at [phone] 08/15/2020, 8:05 AM

## 2020-08-15 NOTE — Progress Notes (Signed)
Pt with urine retention.  Attempt x 3 by nursing team to place foley w/o success.  Have consulted urology to assist.  Coralyn Helling, MD [facility] Pulmonary/Critical Care Pager - [phone] 08/15/2020, 6:29 PM

## 2020-08-15 NOTE — Progress Notes (Signed)
BP progressively lower.  Having difficulty maintaining IV access.  Will start peripheral levophed.  Hold eliquis for now in case she needs central line placement.  Will have soft wrist restraints placed.  Coralyn Helling, MD [facility] Pulmonary/Critical Care Pager - [phone] 08/15/2020, 3:33 PM

## 2020-08-16 ENCOUNTER — Inpatient Hospital Stay (HOSPITAL_COMMUNITY): Payer: Medicare HMO

## 2020-08-16 DIAGNOSIS — J189 Pneumonia, unspecified organism: Secondary | ICD-10-CM | POA: Diagnosis not present

## 2020-08-16 DIAGNOSIS — J9601 Acute respiratory failure with hypoxia: Secondary | ICD-10-CM | POA: Diagnosis not present

## 2020-08-16 DIAGNOSIS — I5022 Chronic systolic (congestive) heart failure: Secondary | ICD-10-CM | POA: Diagnosis not present

## 2020-08-16 DIAGNOSIS — I4891 Unspecified atrial fibrillation: Secondary | ICD-10-CM | POA: Diagnosis not present

## 2020-08-16 LAB — BASIC METABOLIC PANEL
Anion gap: 6 (ref 5–15)
BUN: 17 mg/dL (ref 8–23)
CO2: 23 mmol/L (ref 22–32)
Calcium: 7.9 mg/dL — ABNORMAL LOW (ref 8.9–10.3)
Chloride: 107 mmol/L (ref 98–111)
Creatinine, Ser: 0.76 mg/dL (ref 0.44–1.00)
GFR, Estimated: >60 mL/min (ref >60)
Glucose, Bld: 132 mg/dL — ABNORMAL HIGH (ref 70–99)
Potassium: 3.7 mmol/L (ref 3.5–5.1)
Sodium: 136 mmol/L (ref 135–145)

## 2020-08-16 LAB — URINE CULTURE: Culture: NO GROWTH

## 2020-08-16 LAB — GLUCOSE, CAPILLARY
Glucose-Capillary: 118 mg/dL — ABNORMAL HIGH (ref 70–99)
Glucose-Capillary: 122 mg/dL — ABNORMAL HIGH (ref 70–99)
Glucose-Capillary: 142 mg/dL — ABNORMAL HIGH (ref 70–99)
Glucose-Capillary: 146 mg/dL — ABNORMAL HIGH (ref 70–99)
Glucose-Capillary: 129 mg/dL — ABNORMAL HIGH (ref 70–99)
Glucose-Capillary: 129 mg/dL — ABNORMAL HIGH (ref 70–99)

## 2020-08-16 LAB — CBC
HCT: 25.5 % — ABNORMAL LOW (ref 36.0–46.0)
Hemoglobin: 8.7 g/dL — ABNORMAL LOW (ref 12.0–15.0)
MCH: 32.5 pg (ref 26.0–34.0)
MCHC: 34.1 g/dL (ref 30.0–36.0)
MCV: 95.1 fL (ref 80.0–100.0)
Platelets: 146 10*3/uL — ABNORMAL LOW (ref 150–400)
RBC: 2.68 MIL/uL — ABNORMAL LOW (ref 3.87–5.11)
RDW: 15 % (ref 11.5–15.5)
WBC: 6.7 10*3/uL (ref 4.0–10.5)
nRBC: 0 % (ref 0.0–0.2)

## 2020-08-16 LAB — PRIMIDONE, SERUM
Phenobarbital: 17 ug/mL (ref 15–40)
Primidone, Serum: 19.4 ug/mL — ABNORMAL HIGH (ref 5.0–12.0)

## 2020-08-16 LAB — CORTISOL: Cortisol, Plasma: 12.4 ug/dL

## 2020-08-16 LAB — MAGNESIUM: Magnesium: 2.4 mg/dL (ref 1.7–2.4)

## 2020-08-16 LAB — PHOSPHORUS: Phosphorus: 2.4 mg/dL — ABNORMAL LOW (ref 2.5–4.6)

## 2020-08-16 MED ORDER — AMIODARONE HCL IN DEXTROSE 360-4.14 MG/200ML-% IV SOLN
30.0000 mg/h | INTRAVENOUS | Status: DC
  Administered 2020-08-16 – 2020-08-21 (×11): 30 mg/h via INTRAVENOUS
  Filled 2020-08-16 (×10): qty 200

## 2020-08-16 MED ORDER — AMIODARONE HCL IN DEXTROSE 360-4.14 MG/200ML-% IV SOLN
60.0000 mg/h | INTRAVENOUS | Status: AC
  Administered 2020-08-16: 60 mg/h via INTRAVENOUS
  Filled 2020-08-16: qty 200

## 2020-08-16 MED ORDER — LACTATED RINGERS IV SOLN: INTRAVENOUS | Status: DC

## 2020-08-16 MED ORDER — SODIUM CHLORIDE 0.9% FLUSH
10.0000 mL | Freq: Two times a day (BID) | INTRAVENOUS | Status: DC
  Administered 2020-08-16 – 2020-08-22 (×11): 10 mL

## 2020-08-16 MED ORDER — SODIUM CHLORIDE 0.9% FLUSH: 10.0000 mL | INTRAVENOUS | Status: DC | PRN

## 2020-08-16 MED ORDER — APIXABAN 5 MG PO TABS
5.0000 mg | ORAL_TABLET | Freq: Two times a day (BID) | ORAL | Status: DC
  Administered 2020-08-16 – 2020-08-19 (×7): 5 mg
  Filled 2020-08-16 (×7): qty 1

## 2020-08-16 MED ORDER — LACTATED RINGERS IV BOLUS
1000.0000 mL | Freq: Once | INTRAVENOUS | Status: AC
  Administered 2020-08-16: 1000 mL via INTRAVENOUS

## 2020-08-16 NOTE — Progress Notes (Signed)
NAME:  Katherine Ewing, MRN:  [id number], DOB:  31-Dec-1930, LOS: 3 ADMISSION DATE:  August 28, 2020, CONSULTATION DATE:  08/15/20 REFERRING MD:  Nelson Chimes CHIEF COMPLAINT:  SOB   History of Present Illness:  87 yo female presented with malaise, myalgias, cough, and decreased appetite.  Found to have Rt upper lobe CAP and A fib with RVR and hypotension.  Developed respiratory distress with hypoxia and altered mental status on 3/18.  She required intubation, pressors, and transfer to ICU.  Pertinent  Medical History  Paroxysmal atrial fibrillation, Chronic systolic CHF, HTN, HLD. Hypothyroidism  Significant Hospital Events: Including procedures, antibiotic start and stop dates in addition to other pertinent events   3/16 admit 3/17 cardiology consulted 3/18 transfer to ICU, intubated, start pressors 3/19 start amiodarone  Interim History / Subjective:  Remains on full vent support, pressors, sedation.  Objective   Blood pressure 95/62, pulse 100, temperature (!) 97.3 F (36.3 C), temperature source Axillary, resp. rate (!) 23, height 5\' 2"  (1.575 m), weight 65.9 kg, SpO2 96 %.    Vent Mode: PRVC FiO2 (%):  [40 %-100 %] 40 % Set Rate:  [18 bmp] 18 bmp Vt Set:  [350 mL] 350 mL PEEP:  [5 cmH20] 5 cmH20 Plateau Pressure:  [13 cmH20] 13 cmH20   Intake/Output Summary (Last 24 hours) at 08/16/2020 0939 Last data filed at 08/16/2020 0900 Gross per 24 hour  Intake 3363.15 ml  Output 920 ml  Net 2443.15 ml   Filed Weights   08/28/20 1802 08/15/20 0800 08/16/20 0354  Weight: 66.5 kg 65 kg 65.9 kg    Examination:  General - sedated Eyes - pupils reactive ENT - ETT in place Cardiac - irregular Chest - b/l rhonchi, better air movement Abdomen - soft, non tender, + bowel sounds Extremities - no cyanosis, clubbing, or edema Skin - no rashes Neuro - RASS -2   Labs/imaging personally reviewed    CMP Latest Ref Rng & Units 08/16/2020 08/15/2020 08/15/2020  Glucose 70 - 99 mg/dL 08/17/2020) 182(E) -   BUN 8 - 23 mg/dL 17 15 -  Creatinine 833(V - 1.00 mg/dL 4.45 1.46 -  Sodium 0.47 - 145 mmol/L 136 136 138  Potassium 3.5 - 5.1 mmol/L 3.7 4.4 3.7  Chloride 98 - 111 mmol/L 107 105 -  CO2 22 - 32 mmol/L 23 22 -  Calcium 8.9 - 10.3 mg/dL 7.9(L) 7.9(L) -  Total Protein 6.5 - 8.1 g/dL - - -  Total Bilirubin 0.3 - 1.2 mg/dL - - -  Alkaline Phos 38 - 126 U/L - - -  AST 15 - 41 U/L - - -  ALT 0 - 44 U/L - - -    CBC Latest Ref Rng & Units 08/16/2020 08/15/2020 08/15/2020  WBC 4.0 - 10.5 K/uL 6.7 - 10.3  Hemoglobin 12.0 - 15.0 g/dL 08/17/2020) 7.5(L) 9.9(L)  Hematocrit 36.0 - 46.0 % 25.5(L) 22.0(L) 28.6(L)  Platelets 150 - 400 K/uL 146(L) - 149(L)    ABG    Component Value Date/Time   PHART 7.437 08/15/2020 1033   PCO2ART 34.0 08/15/2020 1033   PO2ART 245 (H) 08/15/2020 1033   HCO3 23.0 08/15/2020 1033   TCO2 24 08/15/2020 1033   ACIDBASEDEF 1.0 08/15/2020 1033   O2SAT 100.0 08/15/2020 1033    CBG (last 3)  Recent Labs    08/15/20 2310 08/16/20 0343 08/16/20 0803  GLUCAP 136* 118* 122*    Resolved Hospital Problem list     Assessment & Plan:   Acute  hypoxic respiratory failure from Rt upper lobe community acquired pneumonia. - ETT 3/18 >> - COVID/flu negative 3/16 - urine pneumococcal antigen negative 3/18 - day 4 of Abx,currently on rocephin and zithromax - f/u blood and sputum culture from 3/18 - f/u legionella antigen from 3/18 - f/u CXR  Hypotension from sepsis, hypovolemia, and sedation. - continue IV fluids - wean pressors to keep MAP > 65 - check cortisol  A.fib with RVR with history of PAF. Hx HTN, HLD, chronic systolic CHF, DVT with PE. - Echo 05/28/19 >> EF 45 to 50% - continue eliquis, lipitor - digoxin per cardiology - amiodarone started 3/19 per cardiology - hold outpt cardizem, toprol losartan, spironolactone, lasix for now   Hypokalemia. - f/u BMET  Hx Hypothyroidism. - Continue home synthroid.  Anemia of critical illness. - f/u CBC -  transfuse for Hb < 7 or significant bleeding  Acute metabolic encephalopathy from sepsis, hypoxia. Essential tremor. - RASS goal -1 to -2 - f/u primidone level from 3/18  Urine retention. - foley placed by urology on 3/18 - keep foley in for now  Best practice (evaluated daily)  Diet:  Tube Feed  Pain/Anxiety/Delirium protocol (if indicated): Yes (RASS goal -1,-2) VAP protocol (if indicated): Yes DVT prophylaxis: Systemic AC GI prophylaxis: PPI Glucose control:  SSI No Central venous access:  Yes, and it is still needed Arterial line:  N/A Foley:  Yes, and it is still needed Mobility:  bed rest  PT consulted: N/A Last date of multidisciplinary goals of care discussion None Code Status:  full code Disposition: ICU  Updated pts husband at bedside   Critical care time: 39 minutes  Coralyn Helling, MD [facility] Pulmonary/Critical Care Pager - [phone] 08/16/2020, 10:01 AM

## 2020-08-16 NOTE — Progress Notes (Signed)
ANTICOAGULATION CONSULT NOTE   Pharmacy Consult for Apixaban Indication: atrial fibrillation  Allergies  Allergen Reactions  . Penicillins Hives    Did it involve swelling of the face/tongue/throat, SOB, or low BP? Y Did it involve sudden or severe rash/hives, skin peeling, or any reaction on the inside of your mouth or nose? Y Did you need to seek medical attention at a hospital or doctor's office? N When did it last happen?Decades Ago If all above answers are "NO", may proceed with cephalosporin use.    . [place] Other (See Comments)    headache  . Adhesive  [Tape] Rash   Patient Measurements: Height: 5\' 2"  (157.5 cm) Weight: 65.9 kg (145 lb 4.5 oz) IBW/kg (Calculated) : 50.1 Vital Signs: Temp: 97.3 F (36.3 C) (03/19 0728) Temp Source: Axillary (03/19 0728) BP: 95/62 (03/19 0920) Pulse Rate: 100 (03/19 0920) Labs: Recent Labs    Sep 07, 2020 1204 08/14/20 0317 08/14/20 0820 08/15/20 0327 08/15/20 1033 08/15/20 1751 08/16/20 0239  HGB 10.5* 9.5*  --  9.9* 7.5*  --  8.7*  HCT 32.4* 28.5*  --  28.6* 22.0*  --  25.5*  PLT 136* 116*  --  149*  --   --  146*  APTT 39* 62* 57*  --   --   --   --   LABPROT 19.2*  --   --   --   --   --   --   INR 1.7*  --   --   --   --   --   --   HEPARINUNFRC  --  1.00*  --   --   --   --   --   CREATININE 1.05*  --   --  0.76  --  0.85 0.76    Estimated Creatinine Clearance: 44.1 mL/min (by C-G formula based on SCr of 0.76 mg/dL).  Assessment: 87 years of age female on Apixaban for atrial fibrillation prior to admission. Apixaban was held briefly on 3/18 (one dose) and resumed today 3/19 at 5mg  BID. Pharmacy consulted by cardiology to dose.   SCr improved at 0.76. Hgb stable at 8.7. Platelets improving at 146. No bleeding reported. Age >80 but weight >60 kg. Ok for 5 mg BID dosing.   Goal of Therapy:  Monitor platelets by anticoagulation protocol: Yes   Plan:  Continue Apixaban as ordered at 5 mg po BID  4/19, PharmD, BCPS, BCCCP Clinical Pharmacist Please refer to [facility] for [facility] Pharmacy numbers 08/16/2020,10:10 AM

## 2020-08-16 NOTE — Progress Notes (Signed)
Progress Note  Patient Name: Katherine Ewing Date of Encounter: 08/16/2020  [facility] Cardiologist: Dr. Owens Shark  Subjective   The patient remains intubated and sedated, she is on 1 mcg of Levophed with blood pressure around 90, heart rates improved.  Inpatient Medications    Scheduled Meds: . atorvastatin  20 mg Per Tube Daily  . chlorhexidine gluconate (MEDLINE KIT)  15 mL Mouth Rinse BID  . Chlorhexidine Gluconate Cloth  6 each Topical Daily  . digoxin  0.125 mg Per Tube Daily  . dorzolamide-timolol  1 drop Both Eyes BID  . latanoprost  1 drop Both Eyes QHS  . levothyroxine  88 mcg Per Tube q morning  . mouth rinse  15 mL Mouth Rinse 10 times per day  . pantoprazole sodium  40 mg Per Tube Q24H  . primidone  250 mg Per Tube BID  . sodium chloride flush  10-40 mL Intracatheter Q12H  . Thrombi-Pad  1 each Topical Once   Continuous Infusions: . sodium chloride 10 mL/hr at 08/16/20 0800  . sodium chloride    . azithromycin Stopped (08/15/20 1553)  . cefTRIAXone (ROCEPHIN)  IV 2 g (08/16/20 0824)  . dexmedetomidine (PRECEDEX) IV infusion 0.6 mcg/kg/hr (08/16/20 0800)  . feeding supplement (VITAL AF 1.2 CAL) 1,000 mL (08/15/20 1215)  . norepinephrine (LEVOPHED) Adult infusion 1 mcg/min (08/16/20 0800)   PRN Meds: sodium chloride, acetaminophen, docusate, fentaNYL (SUBLIMAZE) injection, midazolam, polyethylene glycol, sennosides, sodium chloride flush   Vital Signs    Vitals:   08/16/20 0745 08/16/20 0800 08/16/20 0815 08/16/20 0830  BP: '98/71 97/78 96/67 ' 94/64  Pulse: 92 (!) 123 (!) 129 (!) 105  Resp: 20 (!) 21 (!) 25 (!) 23  Temp:      TempSrc:      SpO2: 96% (!) 88% 92% 96%  Weight:      Height:        Intake/Output Summary (Last 24 hours) at 08/16/2020 0915 Last data filed at 08/16/2020 0800 Gross per 24 hour  Intake 3199.46 ml  Output 920 ml  Net 2279.46 ml   Last 3 Weights 08/16/2020 08/15/2020 08/07/2020  Weight (lbs) 145 lb 4.5 oz 143 lb 4.8 oz 146  lb 9.7 oz  Weight (kg) 65.9 kg 65 kg 66.5 kg      Telemetry    Atrial fibrillation with ventricular rates in 90s to low 100s- Personally Reviewed  ECG    No new tracing - Personally Reviewed  Physical Exam  Intubated, sedated Neck: No JVD Cardiac: iRRR, no murmurs, rubs, or gallops.  Respiratory: Clear to auscultation bilaterally. GI: Soft, nontender, non-distended  MS: No edema; No deformity.  Labs    High Sensitivity Troponin:  No results for input(s): TROPONINIHS in the last 720 hours.    Chemistry Recent Labs  Lab 08/10/2020 1204 08/15/20 0327 08/15/20 1033 08/15/20 1751 08/16/20 0239  NA 135 136 138 136 136  K 3.3* 2.8* 3.7 4.4 3.7  CL 103 101  --  105 107  CO2 22 26  --  22 23  GLUCOSE 87 122*  --  129* 132*  BUN 18 12  --  15 17  CREATININE 1.05* 0.76  --  0.85 0.76  CALCIUM 8.6* 8.4*  --  7.9* 7.9*  PROT 6.2*  --   --   --   --   ALBUMIN 2.6*  --   --   --   --   AST 38  --   --   --   --  ALT 24  --   --   --   --   ALKPHOS 41  --   --   --   --   BILITOT 2.5*  --   --   --   --   GFRNONAA 51* >60  --  >60 >60  ANIONGAP 10 9  --  9 6     Hematology Recent Labs  Lab 08/14/20 0317 08/15/20 0327 08/15/20 1033 08/16/20 0239  WBC 10.7* 10.3  --  6.7  RBC 3.04* 3.08*  --  2.68*  HGB 9.5* 9.9* 7.5* 8.7*  HCT 28.5* 28.6* 22.0* 25.5*  MCV 93.8 92.9  --  95.1  MCH 31.3 32.1  --  32.5  MCHC 33.3 34.6  --  34.1  RDW 15.0 14.9  --  15.0  PLT 116* 149*  --  146*    BNP Recent Labs  Lab 08/14/20 0820  BNP 272.4*     DDimer No results for input(s): DDIMER in the last 168 hours.   Radiology    DG Chest Port 1 View  Result Date: 08/16/2020 CLINICAL DATA:  Respiratory failure, atrial fibrillation, pneumonia  IMPRESSION: 1. Bilateral pleural effusions, likely increased on the right with adjacent areas of passive atelectasis. 2. Patchy airspace opacity in the right upper lobe is similar to slightly decreased from prior. 3. Lines and tubes as above.  Electronically Signed   By: Lovena Le M.D.   On: 08/16/2020 06:45   DG Chest Port 1 View  Result Date: 08/14/2020 CLINICAL DATA:  Dyspnea. EXAM: PORTABLE CHEST 1 VIEW COMPARISON:  Chest x-ray from yesterday. FINDINGS: Unchanged mild cardiomegaly. Mildly improved right upper lobe consolidation. No pneumothorax or pleural effusion. No acute osseous abnormality. IMPRESSION: 1. Mildly improved right upper lobe pneumonia. Electronically Signed   By: Titus Dubin M.D.   On: 08/14/2020 17:00   Cardiac Studies     Patient Profile     87 y.o.femalewith a historyof persistent atrial fibrillation/atrial flutteron Eliquis, previously failed cardioversion, not a candidate for ablation, tachy-mediated cardiomyopathy with EF as low as 30-35% but improved to 45-50% on Echo in 05/2019, prior PE/DVT on chronic anticoagulation, GI bleed felt to be secondary to hemorrhoids, colonoscopy secondary to GI bleed in September 2021 and per GI okay to resume Eliquis, thrombocytopenia, hypertension, hypothyroidism, glaucoma, severe dementia.  Patient was admitted on June 22, 2020 with symptomatic A. fib with RVR with palpitations and hypotension, it was believed that her rapid heart rate was secondary to noncompliance at home, her heart rate improved with restarting of her home Cardizem.    Patient again admitted with palpitations, fatigue, no appetite, hot and cold w/ cough. +myalgias. No dysuria, no CP or abd pain, no n/v/d. No SOB. +palpitations. On EMS arrival HR was high 200-220, and BP's low. She rec'd 500 cc bolus and 20m bolus Cardizem IV w/ 5 mg / hr drip. She remains on cardizem drip and her ventricular rates remains in 120-140'. Pt rx'd in ED w/ IV abx for PNA. COVID is negative.  The patient is a poor historian, only oriented to herself.  Assessment & Plan    Acute respiratory failure with hypoxia, secondary to underlying pneumonia -Status post intubation yesterday -The patient hypotensive,  Cardizem was held, currently on 1 mcg of Levophed with blood pressure 90s  Chronic Atrial Fibrillation now with RVR in the settings of pneumonia -Cardizem and metoprolol held because of hypotension, heart rates currently 90s-110s,  I will start amiodarone drip without loading  dose, continue digoxin and restart Eliquis  Tachymediated Cardiomyopathy - Most recent Echo in 05/2019 at [facility] showed LVEF of 45-50%.  - Appears euvolemic on exam. - Continue home medications: Losartan and spironolactone are being held as she was septic. - Will continue to monitor volume status.   Hypertension -Currently on low-dose Levophed secondary to pneumonia and sepsis.  Prior PE/DVT - On chronic anticoagulation with Eliquis.  With underlying dementia, overall prognosis is poor.  We will follow.  For questions or updates, please contact [place] Please consult [email or web address] for contact info under      Signed, Ena Dawley, MD  08/16/2020, 9:15 AM

## 2020-08-16 NOTE — Progress Notes (Signed)
Pt continues to have minimal urine output despite fluid bolus. Foley catheter has been flushed, no issues, bladder scan only showing 56ml.  Relayed to CCM MD.

## 2020-08-17 ENCOUNTER — Inpatient Hospital Stay (HOSPITAL_COMMUNITY): Payer: Medicare HMO

## 2020-08-17 DIAGNOSIS — I4891 Unspecified atrial fibrillation: Secondary | ICD-10-CM | POA: Diagnosis not present

## 2020-08-17 DIAGNOSIS — I5022 Chronic systolic (congestive) heart failure: Secondary | ICD-10-CM | POA: Diagnosis not present

## 2020-08-17 DIAGNOSIS — J9601 Acute respiratory failure with hypoxia: Secondary | ICD-10-CM | POA: Diagnosis not present

## 2020-08-17 DIAGNOSIS — I10 Essential (primary) hypertension: Secondary | ICD-10-CM | POA: Diagnosis not present

## 2020-08-17 DIAGNOSIS — J189 Pneumonia, unspecified organism: Secondary | ICD-10-CM | POA: Diagnosis not present

## 2020-08-17 LAB — GLUCOSE, CAPILLARY
Glucose-Capillary: 127 mg/dL — ABNORMAL HIGH (ref 70–99)
Glucose-Capillary: 115 mg/dL — ABNORMAL HIGH (ref 70–99)
Glucose-Capillary: 134 mg/dL — ABNORMAL HIGH (ref 70–99)
Glucose-Capillary: 121 mg/dL — ABNORMAL HIGH (ref 70–99)
Glucose-Capillary: 103 mg/dL — ABNORMAL HIGH (ref 70–99)
Glucose-Capillary: 123 mg/dL — ABNORMAL HIGH (ref 70–99)

## 2020-08-17 LAB — CBC
HCT: 27.4 % — ABNORMAL LOW (ref 36.0–46.0)
Hemoglobin: 8.8 g/dL — ABNORMAL LOW (ref 12.0–15.0)
MCH: 31.1 pg (ref 26.0–34.0)
MCHC: 32.1 g/dL (ref 30.0–36.0)
MCV: 96.8 fL (ref 80.0–100.0)
Platelets: 170 10*3/uL (ref 150–400)
RBC: 2.83 MIL/uL — ABNORMAL LOW (ref 3.87–5.11)
RDW: 15.3 % (ref 11.5–15.5)
WBC: 5.8 10*3/uL (ref 4.0–10.5)
nRBC: 0 % (ref 0.0–0.2)

## 2020-08-17 LAB — BASIC METABOLIC PANEL
Anion gap: 4 — ABNORMAL LOW (ref 5–15)
BUN: 17 mg/dL (ref 8–23)
CO2: 23 mmol/L (ref 22–32)
Calcium: 7.7 mg/dL — ABNORMAL LOW (ref 8.9–10.3)
Chloride: 109 mmol/L (ref 98–111)
Creatinine, Ser: 0.69 mg/dL (ref 0.44–1.00)
GFR, Estimated: >60 mL/min (ref >60)
Glucose, Bld: 143 mg/dL — ABNORMAL HIGH (ref 70–99)
Potassium: 3.2 mmol/L — ABNORMAL LOW (ref 3.5–5.1)
Sodium: 136 mmol/L (ref 135–145)

## 2020-08-17 MED ORDER — POTASSIUM CHLORIDE 10 MEQ/50ML IV SOLN
10.0000 meq | INTRAVENOUS | Status: AC
  Administered 2020-08-17 (×4): 10 meq via INTRAVENOUS
  Filled 2020-08-17 (×4): qty 50

## 2020-08-17 MED ORDER — NOREPINEPHRINE 4 MG/250ML-% IV SOLN
0.0000 ug/min | INTRAVENOUS | Status: DC
  Administered 2020-08-17: 1 ug/min via INTRAVENOUS

## 2020-08-17 MED ORDER — POTASSIUM CHLORIDE 20 MEQ PO PACK
40.0000 meq | PACK | Freq: Once | ORAL | Status: AC
  Administered 2020-08-17: 40 meq
  Filled 2020-08-17: qty 2

## 2020-08-17 MED ORDER — POTASSIUM CHLORIDE 20 MEQ PO PACK
20.0000 meq | PACK | ORAL | Status: AC
  Administered 2020-08-17 (×2): 20 meq
  Filled 2020-08-17 (×2): qty 1

## 2020-08-17 MED ORDER — FUROSEMIDE 10 MG/ML IJ SOLN
40.0000 mg | Freq: Once | INTRAMUSCULAR | Status: AC
  Administered 2020-08-17: 40 mg via INTRAVENOUS
  Filled 2020-08-17: qty 4

## 2020-08-17 MED FILL — Medication: Qty: 1 | Status: AC

## 2020-08-17 NOTE — Progress Notes (Signed)
NAME:  Katherine Ewing, MRN:  [id number], DOB:  12/27/1930, LOS: 4 ADMISSION DATE:  08/22/2020, CONSULTATION DATE:  08/15/20 REFERRING MD:  Nelson Chimes CHIEF COMPLAINT:  SOB   History of Present Illness:  87 yo female presented with malaise, myalgias, cough, and decreased appetite.  Found to have Rt upper lobe CAP and A fib with RVR and hypotension.  Developed respiratory distress with hypoxia and altered mental status on 3/18.  She required intubation, pressors, and transfer to ICU.  Pertinent  Medical History  Paroxysmal atrial fibrillation, Chronic systolic CHF, HTN, HLD. Hypothyroidism  Significant Hospital Events: Including procedures, antibiotic start and stop dates in addition to other pertinent events   3/16 admit 3/17 cardiology consulted 3/18 transfer to ICU, intubated, start pressors 3/19 start amiodarone 3/20 off pressors  Interim History / Subjective:  Increased RR with pressure support.  Objective   Blood pressure 97/61, pulse 89, temperature 97.8 F (36.6 C), temperature source Axillary, resp. rate (!) 23, height 5\' 2"  (1.575 m), weight 75.9 kg, SpO2 94 %.    Vent Mode: PRVC FiO2 (%):  [40 %] 40 % Set Rate:  [18 bmp] 18 bmp Vt Set:  [350 mL] 350 mL PEEP:  [5 cmH20] 5 cmH20 Plateau Pressure:  [15 cmH20-18 cmH20] 18 cmH20   Intake/Output Summary (Last 24 hours) at 08/17/2020 0956 Last data filed at 08/17/2020 0900 Gross per 24 hour  Intake 3738.75 ml  Output 365 ml  Net 3373.75 ml   Filed Weights   08/15/20 0800 08/16/20 0354 08/17/20 0430  Weight: 65 kg 65.9 kg 75.9 kg    Examination:  General - sedated Eyes - pupils reactive ENT - ETT in place Cardiac - irregular, tachycardic Chest - scattered rhonchi Abdomen - soft, non tender, + bowel sounds Extremities - 1+ edema Skin - no rashes Neuro - RASS 0, follows simple commands   Labs/imaging personally reviewed    CMP Latest Ref Rng & Units 08/17/2020 08/16/2020 08/15/2020  Glucose 70 - 99 mg/dL 08/17/2020) 767(M)  094(B)  BUN 8 - 23 mg/dL 17 17 15   Creatinine 0.44 - 1.00 mg/dL 096(G 8.36  Sodium 135 - 145 mmol/L 136 136 136  Potassium 3.5 - 5.1 mmol/L 3.2(L) 3.7 4.4  Chloride 98 - 111 mmol/L 109 107 105  CO2 22 - 32 mmol/L 23 23 22   Calcium 8.9 - 10.3 mg/dL 7.7(L) 7.9(L) 7.9(L)  Total Protein 6.5 - 8.1 g/dL - - -  Total Bilirubin 0.3 - 1.2 mg/dL - - -  Alkaline Phos 38 - 126 U/L - - -  AST 15 - 41 U/L - - -  ALT 0 - 44 U/L - - -    CBC Latest Ref Rng & Units 08/17/2020 08/16/2020 08/15/2020  WBC 4.0 - 10.5 K/uL 5.8 6.7 -  Hemoglobin 12.0 - 15.0 g/dL 08/19/2020) 08/18/2020) 7.5(L)  Hematocrit 36.0 - 46.0 % 27.4(L) 25.5(L) 22.0(L)  Platelets 150 - 400 K/uL 170 146(L) -    ABG    Component Value Date/Time   PHART 7.437 08/15/2020 1033   PCO2ART 34.0 08/15/2020 1033   PO2ART 245 (H) 08/15/2020 1033   HCO3 23.0 08/15/2020 1033   TCO2 24 08/15/2020 1033   ACIDBASEDEF 1.0 08/15/2020 1033   O2SAT 100.0 08/15/2020 1033    CBG (last 3)  Recent Labs    08/16/20 2316 08/17/20 0343 08/17/20 0719  GLUCAP 129* 127* 115*    Resolved Hospital Problem list   Hypotension from sepsis and hypovolemia  Assessment & Plan:  Acute hypoxic respiratory failure from Rt upper lobe community acquired pneumonia. - ETT 3/18 >> - COVID/flu negative 3/16 - urine pneumococcal antigen negative 3/18 - day 5 of ABx, currently on rocephin - f/u blood and sputum culture from 3/18 - f/u legionella antigen from 3/18 - f/u CXR  A.fib with RVR with history of PAF. Hx HTN, HLD, chronic systolic CHF, DVT with PE. - Echo 05/28/19 >> EF 45 to 50% - continue eliquis, lipitor - digoxin per cardiology - amiodarone started 3/19 per cardiology - hold outpt cardizem, toprol losartan, spironolactone - lasix 40 mg IV x one on 3/20  Hypokalemia. - supplemental potassium and f/u BMET  Hx Hypothyroidism. - Continue home synthroid  Anemia of critical illness. - f/u CBC - transfuse for Hb < 7 or significant  bleeding  Acute metabolic encephalopathy from sepsis, hypoxia. Essential tremor. - RASS goal 0 to -1 - primidone dose decreased on 3/18  Urine retention. - foley placed by urology on 3/18 - keep foley in for now  Best practice (evaluated daily)  Diet:  Tube Feed  Pain/Anxiety/Delirium protocol (if indicated): Yes (RASS goal 0) VAP protocol (if indicated): Yes DVT prophylaxis: Systemic AC GI prophylaxis: PPI Glucose control:  SSI No Central venous access:  Yes, and it is still needed Arterial line:  N/A Foley:  Yes, and it is still needed Mobility:  bed rest  PT consulted: N/A Last date of multidisciplinary goals of care discussion None Code Status:  full code Disposition: ICU  Updated pt's husband at bedside   Critical care time: 34 minutes  Coralyn Helling, MD [facility] Pulmonary/Critical Care Pager - [phone] 08/17/2020, 9:56 AM

## 2020-08-17 NOTE — Progress Notes (Signed)
Progress Note  Patient Name: Katisha Lenhoff Date of Encounter: 08/17/2020  [facility] Cardiologist: Dr. Owens Shark  Subjective   The patient remains intubated and sedated, off Levophed and being weaned off the ventilator, she remains in atrial fibrillation with controlled rate in 90s however up as she got agitated with ventilator weaning.  Currently in 120s.   Inpatient Medications    Scheduled Meds: . apixaban  5 mg Per Tube BID  . atorvastatin  20 mg Per Tube Daily  . chlorhexidine gluconate (MEDLINE KIT)  15 mL Mouth Rinse BID  . Chlorhexidine Gluconate Cloth  6 each Topical Daily  . digoxin  0.125 mg Per Tube Daily  . dorzolamide-timolol  1 drop Both Eyes BID  . furosemide  40 mg Intravenous Once  . latanoprost  1 drop Both Eyes QHS  . levothyroxine  88 mcg Per Tube q morning  . mouth rinse  15 mL Mouth Rinse 10 times per day  . pantoprazole sodium  40 mg Per Tube Q24H  . potassium chloride  40 mEq Per Tube Once  . primidone  250 mg Per Tube BID  . sodium chloride flush  10-40 mL Intracatheter Q12H   Continuous Infusions: . sodium chloride Stopped (08/16/20 1059)  . sodium chloride    . amiodarone 30 mg/hr (08/17/20 0600)  . cefTRIAXone (ROCEPHIN)  IV Stopped (08/16/20 0854)  . dexmedetomidine (PRECEDEX) IV infusion 1.1 mcg/kg/hr (08/17/20 1008)  . feeding supplement (VITAL AF 1.2 CAL) 1,000 mL (08/16/20 1409)  . potassium chloride 10 mEq (08/17/20 0927)   PRN Meds: sodium chloride, acetaminophen, docusate, fentaNYL (SUBLIMAZE) injection, midazolam, polyethylene glycol, sennosides, sodium chloride flush   Vital Signs    Vitals:   08/17/20 0500 08/17/20 0600 08/17/20 0722 08/17/20 0803  BP: 104/70 97/61    Pulse: 94 89    Resp: (!) 24 (!) 23    Temp:   97.8 F (36.6 C)   TempSrc:   Axillary   SpO2: 94% 95%  94%  Weight:      Height:        Intake/Output Summary (Last 24 hours) at 08/17/2020 1023 Last data filed at 08/17/2020 0900 Gross per 24 hour   Intake 3640.04 ml  Output 350 ml  Net 3290.04 ml   Last 3 Weights 08/17/2020 08/16/2020 08/15/2020  Weight (lbs) 167 lb 5.3 oz 145 lb 4.5 oz 143 lb 4.8 oz  Weight (kg) 75.9 kg 65.9 kg 65 kg      Telemetry    Atrial fibrillation with ventricular rates in 90s to 120s- Personally Reviewed  ECG    No new tracing - Personally Reviewed  Physical Exam  Intubated, sedated Neck: No JVD Cardiac: iRRR, no murmurs, rubs, or gallops.  Respiratory: Clear to auscultation bilaterally. GI: Soft, nontender, non-distended  MS: No edema; No deformity.  Labs    High Sensitivity Troponin:  No results for input(s): TROPONINIHS in the last 720 hours.    Chemistry Recent Labs  Lab 08/06/2020 1204 08/15/20 0327 08/15/20 1751 08/16/20 0239 08/17/20 0335  NA 135   < > 136 136 136  K 3.3*   < > 4.4 3.7 3.2*  CL 103   < > 105 107 109  CO2 22   < > '22 23 23  ' GLUCOSE 87   < > 129* 132* 143*  BUN 18   < > '15 17 17  ' CREATININE 1.05*   < > 0.85 0.76 0.69  CALCIUM 8.6*   < > 7.9* 7.9*  7.7*  PROT 6.2*  --   --   --   --   ALBUMIN 2.6*  --   --   --   --   AST 38  --   --   --   --   ALT 24  --   --   --   --   ALKPHOS 41  --   --   --   --   BILITOT 2.5*  --   --   --   --   GFRNONAA 51*   < > >60 >60 >60  ANIONGAP 10   < > 9 6 4*   < > = values in this interval not displayed.     Hematology Recent Labs  Lab 08/15/20 0327 08/15/20 1033 08/16/20 0239 08/17/20 0335  WBC 10.3  --  6.7 5.8  RBC 3.08*  --  2.68* 2.83*  HGB 9.9* 7.5* 8.7* 8.8*  HCT 28.6* 22.0* 25.5* 27.4*  MCV 92.9  --  95.1 96.8  MCH 32.1  --  32.5 31.1  MCHC 34.6  --  34.1 32.1  RDW 14.9  --  15.0 15.3  PLT 149*  --  146* 170   BNP Recent Labs  Lab 08/14/20 0820  BNP 272.4*    DDimer No results for input(s): DDIMER in the last 168 hours.   Radiology    DG Chest Port 1 View  Result Date: 08/16/2020 CLINICAL DATA:  Respiratory failure, atrial fibrillation, pneumonia  IMPRESSION: 1. Bilateral pleural effusions,  likely increased on the right with adjacent areas of passive atelectasis. 2. Patchy airspace opacity in the right upper lobe is similar to slightly decreased from prior. 3. Lines and tubes as above. Electronically Signed   By: Lovena Le M.D.   On: 08/16/2020 06:45   DG Chest Port 1 View  Result Date: 08/14/2020 CLINICAL DATA:  Dyspnea. EXAM: PORTABLE CHEST 1 VIEW COMPARISON:  Chest x-ray from yesterday. FINDINGS: Unchanged mild cardiomegaly. Mildly improved right upper lobe consolidation. No pneumothorax or pleural effusion. No acute osseous abnormality. IMPRESSION: 1. Mildly improved right upper lobe pneumonia. Electronically Signed   By: Titus Dubin M.D.   On: 08/14/2020 17:00   Cardiac Studies     Patient Profile     87 y.o.femalewith a historyof persistent atrial fibrillation/atrial flutteron Eliquis, previously failed cardioversion, not a candidate for ablation, tachy-mediated cardiomyopathy with EF as low as 30-35% but improved to 45-50% on Echo in 05/2019, prior PE/DVT on chronic anticoagulation, GI bleed felt to be secondary to hemorrhoids, colonoscopy secondary to GI bleed in September 2021 and per GI okay to resume Eliquis, thrombocytopenia, hypertension, hypothyroidism, glaucoma, severe dementia.  Patient was admitted on June 22, 2020 with symptomatic A. fib with RVR with palpitations and hypotension, it was believed that her rapid heart rate was secondary to noncompliance at home, her heart rate improved with restarting of her home Cardizem.    Patient again admitted with palpitations, fatigue, no appetite, hot and cold w/ cough. +myalgias. No dysuria, no CP or abd pain, no n/v/d. No SOB. +palpitations. On EMS arrival HR was high 200-220, and BP's low. She rec'd 500 cc bolus and 10m bolus Cardizem IV w/ 5 mg / hr drip. She remains on cardizem drip and her ventricular rates remains in 120-140'. Pt rx'd in ED w/ IV abx for PNA. COVID is negative.  The patient is a poor  historian, only oriented to herself.  Assessment & Plan    Acute respiratory  failure with hypoxia, secondary to underlying pneumonia -Status post intubation on 08/15/2020 -The patient was hypotensive, Cardizem was held, was on very low-dose of Levophed the first night currently without any pressors, being weaned off the ventilator this morning.  Chronic Atrial Fibrillation now with RVR in the settings of pneumonia -Cardizem and metoprolol held because of hypotension, heart rates currently 90s, weaning protocol up to 120s,  I will continue amiodarone drip and digoxin as well as Eliquis  Tachymediated Cardiomyopathy - Most recent Echo in 05/2019 at [facility] showed LVEF of 45-50%.  - Appears euvolemic on exam. - Continue home medications: Losartan and spironolactone are being held as she was septic. - Will continue to monitor volume status.   Hypertension -Currently hypotensive secondary to pneumonia and sepsis.  Prior PE/DVT - On chronic anticoagulation with Eliquis.  With underlying dementia, overall prognosis is poor.  We will follow.  For questions or updates, please contact [place] Please consult [email or web address] for contact info under      Signed, Ena Dawley, MD  08/17/2020, 10:23 AM

## 2020-08-17 NOTE — Progress Notes (Signed)
Low urine output continues. for entire shift. Bladder scan at 0635 shows 3mL. Foley flushed with no issues. CCM aware, and passing on to day shift RN.

## 2020-08-17 NOTE — Progress Notes (Signed)
AM K+ 3.2 with creat 0.69 and > 60. CCM ELink electrolyte protocol initiated.

## 2020-08-18 ENCOUNTER — Inpatient Hospital Stay (HOSPITAL_COMMUNITY): Payer: Medicare HMO

## 2020-08-18 DIAGNOSIS — J9601 Acute respiratory failure with hypoxia: Secondary | ICD-10-CM | POA: Diagnosis not present

## 2020-08-18 DIAGNOSIS — I5043 Acute on chronic combined systolic (congestive) and diastolic (congestive) heart failure: Secondary | ICD-10-CM | POA: Diagnosis not present

## 2020-08-18 DIAGNOSIS — J189 Pneumonia, unspecified organism: Secondary | ICD-10-CM | POA: Diagnosis not present

## 2020-08-18 DIAGNOSIS — I5022 Chronic systolic (congestive) heart failure: Secondary | ICD-10-CM | POA: Diagnosis not present

## 2020-08-18 DIAGNOSIS — I4891 Unspecified atrial fibrillation: Secondary | ICD-10-CM | POA: Diagnosis not present

## 2020-08-18 LAB — CULTURE, BLOOD (ROUTINE X 2)
Culture: NO GROWTH
Culture: NO GROWTH
Special Requests: ADEQUATE

## 2020-08-18 LAB — ECHOCARDIOGRAM COMPLETE
Area-P 1/2: 5.02 cm2
Height: 62 in
S' Lateral: 2.9 cm
Single Plane A4C EF: 55 %
Weight: 2656.1 [oz_av]

## 2020-08-18 LAB — BASIC METABOLIC PANEL
Anion gap: 7 (ref 5–15)
BUN: 22 mg/dL (ref 8–23)
CO2: 23 mmol/L (ref 22–32)
Calcium: 7.7 mg/dL — ABNORMAL LOW (ref 8.9–10.3)
Chloride: 106 mmol/L (ref 98–111)
Creatinine, Ser: 0.76 mg/dL (ref 0.44–1.00)
GFR, Estimated: >60 mL/min (ref >60)
Glucose, Bld: 145 mg/dL — ABNORMAL HIGH (ref 70–99)
Potassium: 3.9 mmol/L (ref 3.5–5.1)
Sodium: 136 mmol/L (ref 135–145)

## 2020-08-18 LAB — LEGIONELLA PNEUMOPHILA SEROGP 1 UR AG: L. pneumophila Serogp 1 Ur Ag: NEGATIVE

## 2020-08-18 LAB — CBC
HCT: 28.4 % — ABNORMAL LOW (ref 36.0–46.0)
Hemoglobin: 9 g/dL — ABNORMAL LOW (ref 12.0–15.0)
MCH: 31.1 pg (ref 26.0–34.0)
MCHC: 31.7 g/dL (ref 30.0–36.0)
MCV: 98.3 fL (ref 80.0–100.0)
Platelets: 204 10*3/uL (ref 150–400)
RBC: 2.89 MIL/uL — ABNORMAL LOW (ref 3.87–5.11)
RDW: 15.3 % (ref 11.5–15.5)
WBC: 6.7 10*3/uL (ref 4.0–10.5)
nRBC: 0.6 % — ABNORMAL HIGH (ref 0.0–0.2)

## 2020-08-18 LAB — CULTURE, RESPIRATORY W GRAM STAIN: Culture: NO GROWTH

## 2020-08-18 LAB — GLUCOSE, CAPILLARY
Glucose-Capillary: 125 mg/dL — ABNORMAL HIGH (ref 70–99)
Glucose-Capillary: 110 mg/dL — ABNORMAL HIGH (ref 70–99)
Glucose-Capillary: 137 mg/dL — ABNORMAL HIGH (ref 70–99)
Glucose-Capillary: 112 mg/dL — ABNORMAL HIGH (ref 70–99)
Glucose-Capillary: 125 mg/dL — ABNORMAL HIGH (ref 70–99)

## 2020-08-18 MED ORDER — FUROSEMIDE 10 MG/ML IJ SOLN
40.0000 mg | Freq: Once | INTRAMUSCULAR | Status: AC
  Administered 2020-08-18: 40 mg via INTRAVENOUS
  Filled 2020-08-18: qty 4

## 2020-08-18 MED ORDER — DEXMEDETOMIDINE HCL IN NACL 400 MCG/100ML IV SOLN
0.0000 ug/kg/h | INTRAVENOUS | Status: DC
  Administered 2020-08-18 – 2020-08-19 (×4): 1.2 ug/kg/h via INTRAVENOUS
  Filled 2020-08-18: qty 200
  Filled 2020-08-18 (×3): qty 100

## 2020-08-18 MED ORDER — POLYETHYLENE GLYCOL 3350 17 G PO PACK
17.0000 g | PACK | Freq: Two times a day (BID) | ORAL | Status: DC
  Administered 2020-08-19: 17 g
  Filled 2020-08-18: qty 1

## 2020-08-18 MED ORDER — DOCUSATE SODIUM 50 MG/5ML PO LIQD: 50.0000 mg | Freq: Two times a day (BID) | ORAL | Status: DC

## 2020-08-18 MED ORDER — POTASSIUM CHLORIDE 20 MEQ PO PACK
40.0000 meq | PACK | Freq: Once | ORAL | Status: AC
  Administered 2020-08-18: 40 meq
  Filled 2020-08-18: qty 2

## 2020-08-18 NOTE — Progress Notes (Signed)
   08/15/20 0715  Assess: MEWS Score  BP 140/89  Pulse Rate (!) 142  ECG Heart Rate (!) 137  Resp (!) 33  SpO2 (!) 86 %  O2 Device Non-rebreather Mask  O2 Flow Rate (L/min) 15 L/min  Assess: MEWS Score  MEWS Temp 0  MEWS Systolic 0  MEWS Pulse 3  MEWS RR 2  MEWS LOC 0  MEWS Score 5  MEWS Score Color Red  Assess: if the MEWS score is Yellow or Red  Were vital signs taken at a resting state? Yes  Focused Assessment Change from prior assessment (see assessment flowsheet)  Early Detection of Sepsis Score *See Row Information* Low  MEWS guidelines implemented *See Row Information* Yes  Treat  MEWS Interventions Consulted Respiratory Therapy;Administered prn meds/treatments;Escalated (See documentation below)  Pain Scale 0-10  Take Vital Signs  Increase Vital Sign Frequency  Red: Q 1hr X 4 then Q 4hr X 4, if remains red, continue Q 4hrs (q15)  Escalate  MEWS: Escalate Red: discuss with charge nurse/RN and provider, consider discussing with RRT  Notify: Charge Nurse/RN  Name of Charge Nurse/RN Notified Parrott, RN  Date Charge Nurse/RN Notified 08/15/20  Time Charge Nurse/RN Notified 0700  Notify: Provider  Provider Name/Title Dr. Nelson Chimes  Date Provider Notified 08/15/20  Time Provider Notified 0700  Notification Type Page  Notification Reason Change in status  Provider response At bedside  Date of Provider Response 08/15/20  Time of Provider Response 0710  Notify: Rapid Response  Name of Rapid Response RN Notified Shelly, RN  Date Rapid Response Notified 08/15/20  Time Rapid Response Notified 0710  Document  Patient Outcome Transferred/level of care increased  Progress note created (see row info) Yes

## 2020-08-18 NOTE — Progress Notes (Signed)
  Echocardiogram 2D Echocardiogram has been performed.  Katherine Ewing 08/18/2020, 1:55 PM

## 2020-08-18 NOTE — Progress Notes (Signed)
Progress Note  Patient Name: Katherine Ewing Date of Encounter: 08/18/2020  [facility] Cardiologist: No primary care provider on file.   Subjective   Intubated, awakens to voice  Inpatient Medications    Scheduled Meds: . apixaban  5 mg Per Tube BID  . atorvastatin  20 mg Per Tube Daily  . chlorhexidine gluconate (MEDLINE KIT)  15 mL Mouth Rinse BID  . Chlorhexidine Gluconate Cloth  6 each Topical Daily  . digoxin  0.125 mg Per Tube Daily  . docusate  50 mg Per Tube BID  . dorzolamide-timolol  1 drop Both Eyes BID  . latanoprost  1 drop Both Eyes QHS  . levothyroxine  88 mcg Per Tube q morning  . mouth rinse  15 mL Mouth Rinse 10 times per day  . pantoprazole sodium  40 mg Per Tube Q24H  . polyethylene glycol  17 g Per Tube BID  . primidone  250 mg Per Tube BID  . sodium chloride flush  10-40 mL Intracatheter Q12H   Continuous Infusions: . sodium chloride Stopped (08/16/20 1059)  . sodium chloride    . amiodarone 30 mg/hr (08/18/20 1500)  . cefTRIAXone (ROCEPHIN)  IV Stopped (08/18/20 1020)  . dexmedetomidine (PRECEDEX) IV infusion 1.2 mcg/kg/hr (08/18/20 1500)  . feeding supplement (VITAL AF 1.2 CAL) 1,000 mL (08/18/20 1231)  . norepinephrine (LEVOPHED) Adult infusion 1 mcg/min (08/18/20 1500)   PRN Meds: sodium chloride, acetaminophen, docusate, fentaNYL (SUBLIMAZE) injection, midazolam, polyethylene glycol, sennosides, sodium chloride flush   Vital Signs    Vitals:   08/18/20 1300 08/18/20 1330 08/18/20 1400 08/18/20 1430  BP: 129/79 110/68 119/72 (!) 95/57  Pulse: (!) 108 (!) 106 (!) 110 97  Resp: (!) 34 (!) 33 (!) 31 (!) 24  Temp:      TempSrc:      SpO2: 97% 97% 96% 99%  Weight:      Height:        Intake/Output Summary (Last 24 hours) at 08/18/2020 1543 Last data filed at 08/18/2020 1500 Gross per 24 hour  Intake 2201.32 ml  Output 1900 ml  Net 301.32 ml   Last 3 Weights 08/18/2020 08/17/2020 08/16/2020  Weight (lbs) 166 lb 0.1 oz 167 lb 5.3 oz 145  lb 4.5 oz  Weight (kg) 75.3 kg 75.9 kg 65.9 kg      Telemetry    Atrial fibrillation - Personally Reviewed  ECG    No new since 08/16/20 - Personally Reviewed  Physical Exam   GEN: intubated, opens eyes to voice NECK: No JVD appreciated CARDIAC: irregularly irregular rhythm, normal S1 and S2, no rubs or gallops. No murmur. VASCULAR: Radial pulses 2+ bilaterally.  RESPIRATORY:  Ventilated breath sounds ABDOMEN: Soft, non-tender, non-distended SKIN: Warm and dry, no edema NEUROLOGIC:  Intubated/sedated PSYCHIATRIC:  Intubated/sedated  Labs    High Sensitivity Troponin:  No results for input(s): TROPONINIHS in the last 720 hours.    Chemistry Recent Labs  Lab 07/30/2020 1204 08/15/20 0327 08/16/20 0239 08/17/20 0335 08/18/20 0324  NA 135   < > 136 136 136  K 3.3*   < > 3.7 3.2* 3.9  CL 103   < > 107 109 106  CO2 22   < > '23 23 23  ' GLUCOSE 87   < > 132* 143* 145*  BUN 18   < > '17 17 22  ' CREATININE 1.05*   < > 0.76 0.69 0.76  CALCIUM 8.6*   < > 7.9* 7.7* 7.7*  PROT 6.2*  --   --   --   --  ALBUMIN 2.6*  --   --   --   --   AST 38  --   --   --   --   ALT 24  --   --   --   --   ALKPHOS 41  --   --   --   --   BILITOT 2.5*  --   --   --   --   GFRNONAA 51*   < > >60 >60 >60  ANIONGAP 10   < > 6 4* 7   < > = values in this interval not displayed.     Hematology Recent Labs  Lab 08/16/20 0239 08/17/20 0335 08/18/20 0324  WBC 6.7 5.8 6.7  RBC 2.68* 2.83* 2.89*  HGB 8.7* 8.8* 9.0*  HCT 25.5* 27.4* 28.4*  MCV 95.1 96.8 98.3  MCH 32.5 31.1 31.1  MCHC 34.1 32.1 31.7  RDW 15.0 15.3 15.3  PLT 146* 170 204    BNP Recent Labs  Lab 08/14/20 0820  BNP 272.4*     DDimer No results for input(s): DDIMER in the last 168 hours.   Radiology    DG Chest Port 1 View  Result Date: 08/18/2020 CLINICAL DATA:  Respiratory failure EXAM: PORTABLE CHEST 1 VIEW COMPARISON:  08/17/2020, CT 07/06/2018 FINDINGS: Endotracheal tube tip terminates low in the trachea, 2 cm  from the carina. Transesophageal tube tip and side port distal to the GE junction, beyond the margins of imaging. Left IJ approach central venous catheter tip terminates at the left brachiocephalic-caval confluence. Additional external support devices and telemetry leads overlie the chest. Stable cardiomegaly with a calcified, tortuous aorta. Persistent opacity throughout the right hemithorax likely a combination of layering pleural effusion and superimposed patchy airspace disease and/or atelectasis. A smaller layering left effusion with more mild airspace opacity noted in the left mid to lower lung as well. Overall extent of disease is similar to prior. No acute osseous or soft tissue abnormality. Degenerative changes are present in the imaged spine and shoulders. IMPRESSION: 1. Endotracheal tube tip terminates low in the trachea, 2 cm from the carina. Consider retraction 2 cm to the mid trachea. 2. Transesophageal tube tip and side port distal to the GE junction. 3. Stable bilateral effusions with airspace disease and/or atelectasis, right greater than left. These results will be called to the ordering clinician or representative by the Radiologist Assistant, and communication documented in the PACS or [employer]. Electronically Signed   By: Lovena Le M.D.   On: 08/18/2020 06:37   DG Chest Port 1 View  Result Date: 08/17/2020 CLINICAL DATA:  Respiratory failure. EXAM: PORTABLE CHEST 1 VIEW COMPARISON:  Prior chest radiographs 08/16/2020 and earlier. FINDINGS: ET tube terminating 3 cm above the level of the carina. An enteric tube passes below the level of the left hemidiaphragm with tip excluded from the field of view. A left IJ approach central venous catheter is present with tip projecting in the region of the brachiocephalic/SVC confluence. Unchanged cardiomegaly. Aortic atherosclerosis. Hazy gradient opacity within the right thorax compatible layering pleural effusion. Superimposed patchy  airspace disease within the right lung. Increased small left pleural effusion and associated left basilar atelectasis and/or consolidation. IMPRESSION: Layering pleural effusion on the right, increased as compared to the prior exam of 08/16/2020. Superimposed patchy airspace disease within the right lung, similar to the prior examination. Increased small left pleural effusion and associated left basilar atelectasis and/or consolidation. Electronically Signed   By: Kellie Simmering DO  On: 08/17/2020 07:51    Cardiac Studies   Echo  Pending today  Patient Profile     87 y.o. female with PMH atrial fib/flutter, tachy-mediated cardiomyopathy, prior PE/DVT who presented with shortness of breath, hypotension, atrial fibrillation with RVR  Assessment & Plan    Right upper lobe pneumonia with hypoxic respiratory failure and septic shock, requiring intubation and pressors -management per PCCM -remains intubated/ventilated at this time  Atrial fibrillation with RVR History of tachycardia-mediated cardiomyopathy -pending echo read today. -would avoid diltiazem given history of low EF -continue amiodarone, apixaban -started on digoxin this admission as well -once hemodynamically stable, can consider metoprolol if warm/well perfused -RVR likely exacerbated by acute illnesss  History of hypotension, with shock requiring pressors this admission -holding diltiazem, losartan, metoprolol, spironolactone given shock   For questions or updates, please contact [facility] Please consult [email or web address] for contact info under        Signed, Buford Dresser, MD  08/18/2020, 3:43 PM

## 2020-08-18 NOTE — Progress Notes (Signed)
NAME:  Katherine Ewing, MRN:  [id number], DOB:  July 25, 1930, LOS: 5 ADMISSION DATE:  09-05-20, CONSULTATION DATE:  08/15/20 REFERRING MD:  Nelson Chimes CHIEF COMPLAINT:  SOB   History of Present Illness:  87 yo female presented with malaise, myalgias, cough, and decreased appetite.  Found to have Rt upper lobe CAP and A fib with RVR and hypotension.  Developed respiratory distress with hypoxia and altered mental status on 3/18.  She required intubation, pressors, and transfer to ICU.  Pertinent  Medical History  Paroxysmal atrial fibrillation, Chronic systolic CHF, HTN, HLD. Hypothyroidism  Significant Hospital Events: Including procedures, antibiotic start and stop dates in addition to other pertinent events   3/16 admit 3/17 cardiology consulted 3/18 transfer to ICU, intubated, start pressors 3/19 start amiodarone 3/20 off pressors temporarily  Interim History / Subjective:   Pt still requiring pressor support overnight and this morning.  PSV this AM with good oxygenation. Sleepy on Precedex but arousable to voice.  Objective   Blood pressure 104/64, pulse (!) 104, temperature 98.2 F (36.8 C), temperature source Oral, resp. rate (!) 28, height 5\' 2"  (1.575 m), weight 75.3 kg, SpO2 98 %.    Vent Mode: PRVC FiO2 (%):  [40 %] 40 % Set Rate:  [18 bmp] 18 bmp Vt Set:  [350 mL] 350 mL PEEP:  [5 cmH20] 5 cmH20 Plateau Pressure:  [14 cmH20-16 cmH20] 16 cmH20   Intake/Output Summary (Last 24 hours) at 08/18/2020 0852 Last data filed at 08/18/2020 08/20/2020 Gross per 24 hour  Intake 1954.35 ml  Output 1440 ml  Net 514.35 ml   Filed Weights   08/16/20 0354 08/17/20 0430 08/18/20 0329  Weight: 65.9 kg 75.9 kg 75.3 kg    Examination:  General - acutely ill appearing female, intubated and sedated on propofol HENT: anicteric sclera, MMM, ETT in place, OG tube Cardiac: tachycardic, irregularly irregular rate, normal S1S2 Chest: bilateral ronchi R>L, symmetric chest rise, mechanically  ventilated Abdomen: soft, non-tender, non-distended, NABS Extremities: trace edema bilateral lower extremities Skin: warm, well perfused, no rashes Neuro: RASS -1, arouses briefly to loud verbal stimuli and touch   Labs/imaging personally reviewed    CMP Latest Ref Rng & Units 08/18/2020 08/17/2020 08/16/2020  Glucose 70 - 99 mg/dL 08/18/2020) 242(A) 834(H)  BUN 8 - 23 mg/dL 22 17 17   Creatinine 0.44 - 1.00 mg/dL 962(I 2.97  Sodium 135 - 145 mmol/L 136 136 136  Potassium 3.5 - 5.1 mmol/L 3.9 3.2(L) 3.7  Chloride 98 - 111 mmol/L 106 109 107  CO2 22 - 32 mmol/L 23 23 23   Calcium 8.9 - 10.3 mg/dL 7.7(L) 7.7(L) 7.9(L)  Total Protein 6.5 - 8.1 g/dL - - -  Total Bilirubin 0.3 - 1.2 mg/dL - - -  Alkaline Phos 38 - 126 U/L - - -  AST 15 - 41 U/L - - -  ALT 0 - 44 U/L - - -    CBC Latest Ref Rng & Units 08/18/2020 08/17/2020 08/16/2020  WBC 4.0 - 10.5 K/uL 6.7 5.8 6.7  Hemoglobin 12.0 - 15.0 g/dL 9.0(L) 8.8(L) 8.7(L)  Hematocrit 36.0 - 46.0 % 28.4(L) 27.4(L) 25.5(L)  Platelets 150 - 400 K/uL 204 170 146(L)    ABG    Component Value Date/Time   PHART 7.437 08/15/2020 1033   PCO2ART 34.0 08/15/2020 1033   PO2ART 245 (H) 08/15/2020 1033   HCO3 23.0 08/15/2020 1033   TCO2 24 08/15/2020 1033   ACIDBASEDEF 1.0 08/15/2020 1033   O2SAT 100.0 08/15/2020 1033  CBG (last 3)  Recent Labs    08/17/20 2319 08/18/20 0324 08/18/20 0812  GLUCAP 123* 125* 110*    Resolved Hospital Problem list   Hypotension from sepsis and hypovolemia  Assessment & Plan:   Acute hypoxic respiratory failure from Rt upper lobe community acquired pneumonia. Pulmonary Edema - ETT 3/18 >> - Oxygenating well this morning on PSV, consider extubation tomorrow depending on tolerance of SBT and mental status - infectious work-up negative, however high suspicion for CAP given history and CXR at admission -cont Ceftriaxone, day 6 of 7  A.fib with RVR with history of PAF. Hx HTN, HLD, chronic systolic CHF, DVT  with PE. - Echo 05/28/19 >> EF 45 to 50% - continue eliquis, lipitor - digoxin and amiodarone, per cardiology - hold outpt cardizem, toprol, losartan, and spironolactone - consider restarting home meds once off pressors - lasix 40 mg today - f/u TTE today  Hypokalemia. - K 3.9 this AM, replete as needed - serial BMP  Hx Hypothyroidism. - Continue home synthroid  Anemia of critical illness. - stable at 9 this AM - transfuse for Hb < 7 or significant bleeding  Acute metabolic encephalopathy from sepsis, hypoxia. Essential tremor. - RASS goal 0 to -1 - primidone dose decreased on 3/18  Urine retention. - foley placed by urology on 3/18 - keep foley in for now  Constipation - no bowel movement since 3/16 - Miralax and Docusate BID  Best practice (evaluated daily)  Diet:  Tube Feed  Pain/Anxiety/Delirium protocol (if indicated): Yes (RASS goal 0) VAP protocol (if indicated): Yes DVT prophylaxis: Systemic AC GI prophylaxis: PPI Glucose control:  SSI No Central venous access:  Yes, and it is still needed Arterial line:  N/A Foley:  Yes, and it is still needed Mobility:  bed rest  PT consulted: N/A Last date of multidisciplinary goals of care discussion None Code Status:  full code Disposition: ICU   Jori Moll, Medical Student 08/18/2020 , 8:59 AM

## 2020-08-19 ENCOUNTER — Inpatient Hospital Stay (HOSPITAL_COMMUNITY): Payer: Medicare HMO

## 2020-08-19 DIAGNOSIS — I9589 Other hypotension: Secondary | ICD-10-CM

## 2020-08-19 DIAGNOSIS — J189 Pneumonia, unspecified organism: Secondary | ICD-10-CM | POA: Diagnosis not present

## 2020-08-19 DIAGNOSIS — E861 Hypovolemia: Secondary | ICD-10-CM

## 2020-08-19 DIAGNOSIS — I4891 Unspecified atrial fibrillation: Secondary | ICD-10-CM | POA: Diagnosis not present

## 2020-08-19 LAB — BASIC METABOLIC PANEL
Anion gap: 7 (ref 5–15)
BUN: 25 mg/dL — ABNORMAL HIGH (ref 8–23)
CO2: 25 mmol/L (ref 22–32)
Calcium: 7.8 mg/dL — ABNORMAL LOW (ref 8.9–10.3)
Chloride: 105 mmol/L (ref 98–111)
Creatinine, Ser: 0.68 mg/dL (ref 0.44–1.00)
GFR, Estimated: >60 mL/min (ref >60)
Glucose, Bld: 152 mg/dL — ABNORMAL HIGH (ref 70–99)
Potassium: 3.7 mmol/L (ref 3.5–5.1)
Sodium: 137 mmol/L (ref 135–145)

## 2020-08-19 LAB — CBC
HCT: 26.9 % — ABNORMAL LOW (ref 36.0–46.0)
Hemoglobin: 8.9 g/dL — ABNORMAL LOW (ref 12.0–15.0)
MCH: 32.2 pg (ref 26.0–34.0)
MCHC: 33.1 g/dL (ref 30.0–36.0)
MCV: 97.5 fL (ref 80.0–100.0)
Platelets: 187 10*3/uL (ref 150–400)
RBC: 2.76 MIL/uL — ABNORMAL LOW (ref 3.87–5.11)
RDW: 15.4 % (ref 11.5–15.5)
WBC: 6 10*3/uL (ref 4.0–10.5)
nRBC: 0.5 % — ABNORMAL HIGH (ref 0.0–0.2)

## 2020-08-19 LAB — GLUCOSE, CAPILLARY
Glucose-Capillary: 110 mg/dL — ABNORMAL HIGH (ref 70–99)
Glucose-Capillary: 117 mg/dL — ABNORMAL HIGH (ref 70–99)
Glucose-Capillary: 125 mg/dL — ABNORMAL HIGH (ref 70–99)
Glucose-Capillary: 130 mg/dL — ABNORMAL HIGH (ref 70–99)
Glucose-Capillary: 85 mg/dL (ref 70–99)
Glucose-Capillary: 90 mg/dL (ref 70–99)
Glucose-Capillary: 91 mg/dL (ref 70–99)

## 2020-08-19 LAB — PHOSPHORUS: Phosphorus: 3.1 mg/dL (ref 2.5–4.6)

## 2020-08-19 MED ORDER — DEXMEDETOMIDINE HCL IN NACL 400 MCG/100ML IV SOLN
0.4000 ug/kg/h | INTRAVENOUS | Status: DC
  Administered 2020-08-19: 0.4 ug/kg/h via INTRAVENOUS
  Filled 2020-08-19 (×3): qty 100

## 2020-08-19 MED ORDER — DIGOXIN 125 MCG PO TABS
0.1250 mg | ORAL_TABLET | Freq: Every day | ORAL | Status: DC
  Administered 2020-08-20 – 2020-08-22 (×3): 0.125 mg via ORAL
  Filled 2020-08-19 (×4): qty 1

## 2020-08-19 MED ORDER — ORAL CARE MOUTH RINSE
15.0000 mL | Freq: Two times a day (BID) | OROMUCOSAL | Status: DC
  Administered 2020-08-20 – 2020-08-23 (×6): 15 mL via OROMUCOSAL

## 2020-08-19 MED ORDER — ATORVASTATIN CALCIUM 10 MG PO TABS
20.0000 mg | ORAL_TABLET | Freq: Every day | ORAL | Status: DC
  Administered 2020-08-20 – 2020-08-22 (×3): 20 mg via ORAL
  Filled 2020-08-19 (×4): qty 2

## 2020-08-19 MED ORDER — APIXABAN 5 MG PO TABS
5.0000 mg | ORAL_TABLET | Freq: Two times a day (BID) | ORAL | Status: DC
  Administered 2020-08-19 – 2020-08-22 (×6): 5 mg via ORAL
  Filled 2020-08-19 (×8): qty 1

## 2020-08-19 MED ORDER — METOPROLOL TARTRATE 12.5 MG HALF TABLET
12.5000 mg | ORAL_TABLET | Freq: Two times a day (BID) | ORAL | Status: DC
  Administered 2020-08-19 – 2020-08-21 (×4): 12.5 mg via ORAL
  Filled 2020-08-19 (×4): qty 1

## 2020-08-19 MED ORDER — METOPROLOL TARTRATE 5 MG/5ML IV SOLN
5.0000 mg | INTRAVENOUS | Status: DC | PRN
  Administered 2020-08-19 – 2020-08-21 (×4): 5 mg via INTRAVENOUS
  Filled 2020-08-19 (×5): qty 5

## 2020-08-19 MED ORDER — LEVOTHYROXINE SODIUM 88 MCG PO TABS
88.0000 ug | ORAL_TABLET | Freq: Every morning | ORAL | Status: DC
  Administered 2020-08-20 – 2020-08-23 (×3): 88 ug via ORAL
  Filled 2020-08-19 (×4): qty 1

## 2020-08-19 MED ORDER — PRIMIDONE 250 MG PO TABS
250.0000 mg | ORAL_TABLET | Freq: Two times a day (BID) | ORAL | Status: DC
  Administered 2020-08-19 – 2020-08-22 (×6): 250 mg via ORAL
  Filled 2020-08-19 (×8): qty 1

## 2020-08-19 NOTE — Evaluation (Addendum)
Clinical/Bedside Swallow Evaluation Patient Details  Name: Katherine Ewing MRN: [id number] Date of Birth: Sep 18, 1930  Today's Date: 08/19/2020 Time: SLP Start Time (ACUTE ONLY): 1535 SLP Stop Time (ACUTE ONLY): 1550 SLP Time Calculation (min) (ACUTE ONLY): 15 min  Past Medical History:  Past Medical History:  Diagnosis Date  . Hypertension   . MVP (mitral valve prolapse)    Past Surgical History:  Past Surgical History:  Procedure Laterality Date  . ABDOMINAL HYSTERECTOMY    . APPENDECTOMY    . CHOLECYSTECTOMY    . COLONOSCOPY WITH PROPOFOL N/A 02/01/2020   Procedure: COLONOSCOPY WITH PROPOFOL;  Surgeon: Jeani Hawking, MD;  Location: [facility];  Service: Endoscopy;  Laterality: N/A;  . POLYPECTOMY  02/01/2020   Procedure: POLYPECTOMY;  Surgeon: Jeani Hawking, MD;  Location: [facility];  Service: Endoscopy;;   HPI:  87 y.o. female presented to [facility] ED on 08/01/2020 with 3 days of fatigue, chills, cough. Pt found to be tachy and hypotensive in ED. CXR in ED showed RUL PNA.  No hx of dysphagia per pt/husband at that time. Underwent clinical swallow evaluation on 3/17, no concerns for dysphagia. Oral-motor examination was unremarkable with intact dentition. Self administered multiple sips water via cup/straw and regular texture without indications of aspiration. Respirations were normal without audible congestion. Therapist upgraded texture from clears to regular, thin liquids. No further ST f/u was recommended. Overnight and morning of 3/18 she became obtunded with severe respiratory distress requiring intubation was transferred to the ICU.  Extubated 3/22. PMH includes HTN, MV prolapse, PAF on eliquis, chronic syst CHF, hypothyroidism, thrombocytopenia.   Assessment / Plan / Recommendation Clinical Impression  Pt extubated this am, with Sp02 high 80s this afternoon, tachypnea to 37 during time of this assessment, and refusing BiPAP per RN.  She was quite confused but willing to accept some POs  during clinical swallow assessment.  Voice quite aphonic s/p four-day intubation.  Pt accepted a few ice chips, with likely delays in swallow initiation; no cough, +ongoing aphonia.  She adamantly refused applesauce but accepted three boluses of pudding, which she enjoyed.  Given current respiratory status and aphonia after intubation, she is at high risk for aspiration of POs (primarily liquids). For now, continue NPO and allow critical meds in pudding (crush if larger than 13 mm).  Offer only occasional ice chips after oral care (4-5 chips).D/W RN. SLP will follow along and reassess next date if appropriate. SLP Visit Diagnosis: Dysphagia, oropharyngeal phase (R13.12)    Aspiration Risk  Severe aspiration risk    Diet Recommendation   NPO except critical meds in pudding; occasional ice chips after oral care       Other  Recommendations Oral Care Recommendations: Oral care QID   Follow up Recommendations Other (comment) (tba)      Frequency and Duration min 2x/week  2 weeks       Prognosis Prognosis for Safe Diet Advancement: Guarded Barriers to Reach Goals: Severity of deficits      Swallow Study   General Date of Onset: 08/22/2020 HPI: 87 y.o. female presented to [facility] ED on 07/30/2020 with 3 days of fatigue, chills, cough. Pt found to be tachy and hypotensive in ED. CXR in ED showed RUL PNA.  No hx of dysphagia per pt/husband at that time. Underwent clinical swallow evaluation on 3/17, no concerns for dysphagia. Oral-motor examination was unremarkable with intact dentition. Self administered multiple sips water via cup/straw and regular texture without indications of aspiration. Respirations were normal without audible  congestion. Therapist upgraded texture from clears to regular, thin liquids. No further ST f/u was recommended. Overnight and morning of 3/18 she became obtunded with severe respiratory distress requiring intubation was transferred to the ICU.  Extubated 3/22. PMH includes  HTN, MV prolapse, PAF on eliquis, chronic syst CHF, hypothyroidism, thrombocytopenia. Type of Study: Bedside Swallow Evaluation Previous Swallow Assessment: see HPI Diet Prior to this Study: NPO Temperature Spikes Noted: No Respiratory Status: Nasal cannula History of Recent Intubation: Yes Length of Intubations (days): 4 days Date extubated: 08/19/20 Behavior/Cognition: Alert;Confused Oral Cavity Assessment: Within Functional Limits Oral Care Completed by SLP: No Oral Cavity - Dentition: Adequate natural dentition Self-Feeding Abilities: Needs assist Patient Positioning: Upright in bed Baseline Vocal Quality: Aphonic Volitional Cough: Weak Volitional Swallow: Able to elicit    Oral/Motor/Sensory Function Overall Oral Motor/Sensory Function: Within functional limits   Ice Chips Ice chips: Impaired Presentation: Spoon Oral Phase Impairments: Poor awareness of bolus Pharyngeal Phase Impairments: Suspected delayed Swallow   Thin Liquid Thin Liquid: Not tested    Nectar Thick Nectar Thick Liquid: Not tested   Honey Thick Honey Thick Liquid: Not tested   Puree Puree: Within functional limits   Solid     Solid: Not tested      Blenda Mounts Laurice 08/19/2020,4:07 PM  Marchelle Folks L. Samson Frederic, MA CCC/SLP Acute Rehabilitation Services Office number [phone] Pager [phone]

## 2020-08-19 NOTE — Progress Notes (Signed)
eLink Physician-Brief Progress Note Patient Name: Katherine Ewing DOB: January 28, 1931 MRN: [id number]   Date of Service  08/19/2020  HPI/Events of Note  Notified that patient is increasingly agitated and is now back in afib RVR 120s. BP 147/123  Patient seen awake and appears restless  eICU Interventions  Will resume low dose Precedex tonight to allow patient to rest and hopefully be re-oriented after     Intervention Category Minor Interventions: Agitation / anxiety - evaluation and management  Darl Pikes 08/19/2020, 9:36 PM

## 2020-08-19 NOTE — Progress Notes (Signed)
Progress Note  Patient Name: Nasirah Glomski Date of Encounter: 08/19/2020  [facility] Cardiologist: Osborne Oman  Subjective   Husband at bedside today. She is extubated but not speaking, tracks with eyes. Her husband and I discussed her afib and cardiac concerns today.  Inpatient Medications    Scheduled Meds: . apixaban  5 mg Oral BID  . [START ON 08/20/2020] atorvastatin  20 mg Oral Daily  . chlorhexidine gluconate (MEDLINE KIT)  15 mL Mouth Rinse BID  . Chlorhexidine Gluconate Cloth  6 each Topical Daily  . [START ON 08/20/2020] digoxin  0.125 mg Oral Daily  . dorzolamide-timolol  1 drop Both Eyes BID  . latanoprost  1 drop Both Eyes QHS  . [START ON 08/20/2020] levothyroxine  88 mcg Oral q morning  . mouth rinse  15 mL Mouth Rinse 10 times per day  . pantoprazole sodium  40 mg Per Tube Q24H  . primidone  250 mg Oral BID  . sodium chloride flush  10-40 mL Intracatheter Q12H   Continuous Infusions: . sodium chloride Stopped (08/16/20 1059)  . sodium chloride    . amiodarone 30 mg/hr (08/19/20 1100)  . norepinephrine (LEVOPHED) Adult infusion Stopped (08/18/20 2232)   PRN Meds: sodium chloride, acetaminophen, polyethylene glycol, sennosides, sodium chloride flush   Vital Signs    Vitals:   08/19/20 0500 08/19/20 0600 08/19/20 0743 08/19/20 0924  BP: (!) 101/57 95/64 (!) 97/53   Pulse: 79 77  75  Resp: (!) 27 (!) 23    Temp:      TempSrc:      SpO2: 98% 97% 98%   Weight: 74.8 kg     Height:        Intake/Output Summary (Last 24 hours) at 08/19/2020 1215 Last data filed at 08/19/2020 1100 Gross per 24 hour  Intake 2181.35 ml  Output 2095 ml  Net 86.35 ml   Last 3 Weights 08/19/2020 08/18/2020 08/17/2020  Weight (lbs) 164 lb 14.5 oz 166 lb 0.1 oz 167 lb 5.3 oz  Weight (kg) 74.8 kg 75.3 kg 75.9 kg      Telemetry    Atrial fibrillation, rate controlled - Personally Reviewed  ECG    No new since 08/16/20 - Personally Reviewed  Physical Exam   GEN: Well  nourished, well developed in no acute distress NECK: No JVD CARDIAC: irregularly irregular rhythm, normal S1 and S2, no rubs or gallops. No murmur. VASCULAR: Radial pulses 2+ bilaterally.  RESPIRATORY:  Coarse throughout ABDOMEN: Soft, non-tender, non-distended MUSCULOSKELETAL:  Moves all 4 limbs independently SKIN: Warm and dry, no edema NEUROLOGIC:  Not speaking but tracks, nods head to try to communicate PSYCHIATRIC:  Calm, no distress  Labs    High Sensitivity Troponin:  No results for input(s): TROPONINIHS in the last 720 hours.    Chemistry Recent Labs  Lab 08/04/2020 1204 08/15/20 0327 08/17/20 0335 08/18/20 0324 08/19/20 0334  NA 135   < > 136 136 137  K 3.3*   < > 3.2* 3.9 3.7  CL 103   < > 109 106 105  CO2 22   < > _0 GLUCOSE 87   < > 143* 145* 152*  BUN 18   < > 17 22 25*  CREATININE 1.05*   < > 0.69 0.76 0.68  CALCIUM 8.6*   < > 7.7* 7.7* 7.8*  PROT 6.2*  --   --   --   --   ALBUMIN 2.6*  --   --   --   --  AST 38  --   --   --   --   ALT 24  --   --   --   --   ALKPHOS 41  --   --   --   --   BILITOT 2.5*  --   --   --   --   GFRNONAA 51*   < > >60 >60 >60  ANIONGAP 10   < > 4* 7 7   < > = values in this interval not displayed.     Hematology Recent Labs  Lab 08/17/20 0335 08/18/20 0324 08/19/20 0334  WBC 5.8 6.7 6.0  RBC 2.83* 2.89* 2.76*  HGB 8.8* 9.0* 8.9*  HCT 27.4* 28.4* 26.9*  MCV 96.8 98.3 97.5  MCH 31.1 31.1 32.2  MCHC 32.1 31.7 33.1  RDW 15.3 15.3 15.4  PLT 170 204 187    BNP Recent Labs  Lab 08/14/20 0820  BNP 272.4*     DDimer No results for input(s): DDIMER in the last 168 hours.   Radiology    DG Chest Port 1 View  Result Date: 08/19/2020 CLINICAL DATA:  Respiratory failure. EXAM: PORTABLE CHEST 1 VIEW COMPARISON:  08/18/2020. FINDINGS: Endotracheal tube tip again noted 2 cm from the carina. NG tube noted with tip below left hemidiaphragm. Left central line stable position. Stable cardiomegaly. Bilateral  infiltrates/edema, right side greater than left again noted. Slight interim improvement. Bilateral small pleural effusions again noted. No pneumothorax. IMPRESSION: 1. Lines and tubes in stable position. 2. Stable cardiomegaly. 3. Bilateral infiltrates/edema, right side greater than left again noted. Slight interim improvement. Bilateral small pleural effusions again noted. Electronically Signed   By: Marcello Moores  Register   On: 08/19/2020 05:09   DG Chest Port 1 View  Result Date: 08/18/2020 CLINICAL DATA:  Respiratory failure EXAM: PORTABLE CHEST 1 VIEW COMPARISON:  08/17/2020, CT 07/06/2018 FINDINGS: Endotracheal tube tip terminates low in the trachea, 2 cm from the carina. Transesophageal tube tip and side port distal to the GE junction, beyond the margins of imaging. Left IJ approach central venous catheter tip terminates at the left brachiocephalic-caval confluence. Additional external support devices and telemetry leads overlie the chest. Stable cardiomegaly with a calcified, tortuous aorta. Persistent opacity throughout the right hemithorax likely a combination of layering pleural effusion and superimposed patchy airspace disease and/or atelectasis. A smaller layering left effusion with more mild airspace opacity noted in the left mid to lower lung as well. Overall extent of disease is similar to prior. No acute osseous or soft tissue abnormality. Degenerative changes are present in the imaged spine and shoulders. IMPRESSION: 1. Endotracheal tube tip terminates low in the trachea, 2 cm from the carina. Consider retraction 2 cm to the mid trachea. 2. Transesophageal tube tip and side port distal to the GE junction. 3. Stable bilateral effusions with airspace disease and/or atelectasis, right greater than left. These results will be called to the ordering clinician or representative by the Radiologist Assistant, and communication documented in the PACS or [employer]. Electronically Signed   By: Lovena Le M.D.   On: 08/18/2020 06:37   ECHOCARDIOGRAM COMPLETE  Result Date: 08/18/2020    ECHOCARDIOGRAM REPORT   Patient Name:   SHANESE Reidinger Date of Exam: 08/18/2020 Medical Rec #:  [id number]   Height:       62.0 in Accession #:    [id number]  Weight:       166.0 lb Date of Birth:  1931/04/30   BSA:  1.766 m Patient Age:    87 years    BP:           110/68 mmHg Patient Gender: F           HR:           106 bpm. Exam Location:  Inpatient Procedure: 2D Echo, Cardiac Doppler and Color Doppler Indications:    CHF  History:        Patient has no prior history of Echocardiogram examinations.                 CHF, Arrythmias:Atrial Fibrillation, Signs/Symptoms:Altered                 Mental Status; Risk Factors:Hypertension and Dyslipidemia.                 Pneumonia, resp, failure.  Sonographer:    Dustin Flock Referring Phys: Toney Sang SOOD  Sonographer Comments: Echo performed with patient supine and on artificial respirator. IMPRESSIONS  1. Endocardium is difficult to see. Overall LVEF is probably moderately depressed with hypokineis of the septum, anterior, inferior and apical wall segments Would recomm limited echo with Definity to further define wall motion and LVEF. The left ventricle demonstrates global hypokinesis. Left ventricular diastolic parameters are indeterminate.  2. Right ventricular systolic function is low normal. The right ventricular size is normal. There is normal pulmonary artery systolic pressure.  3. Right atrial size was severely dilated.  4. Mild mitral valve regurgitation.  5. The aortic valve is tricuspid. Aortic valve regurgitation is trivial.  6. The inferior vena cava is dilated in size with <50% respiratory variability, suggesting right atrial pressure of 15 mmHg. FINDINGS  Left Ventricle: Endocardium is difficult to see. Overall LVEF is probably moderately depressed with hypokineis of the septum, anterior, inferior and apical wall segments Would recomm limited echo with  Definity to further define wall motion and LVEF. The  left ventricle demonstrates global hypokinesis. The left ventricular internal cavity size was normal in size. There is no left ventricular hypertrophy. Left ventricular diastolic parameters are indeterminate. Right Ventricle: The right ventricular size is normal. Right vetricular wall thickness was not assessed. Right ventricular systolic function is low normal. There is normal pulmonary artery systolic pressure. The tricuspid regurgitant velocity is 2.78 m/s, and with an assumed right atrial pressure of 3 mmHg, the estimated right ventricular systolic pressure is 44.0 mmHg. Left Atrium: Left atrial size was normal in size. Right Atrium: Right atrial size was severely dilated. Pericardium: There is no evidence of pericardial effusion. Mitral Valve: There is mild thickening of the mitral valve leaflet(s). Mild mitral valve regurgitation. Tricuspid Valve: The tricuspid valve is normal in structure. Tricuspid valve regurgitation is mild. Aortic Valve: The aortic valve is tricuspid. Aortic valve regurgitation is trivial. Pulmonic Valve: The pulmonic valve was grossly normal. Pulmonic valve regurgitation is not visualized. Aorta: The aortic root is normal in size and structure. Venous: The inferior vena cava is dilated in size with less than 50% respiratory variability, suggesting right atrial pressure of 15 mmHg.  LEFT VENTRICLE PLAX 2D LVIDd:         3.90 cm     Diastology LVIDs:         2.90 cm     LV e' medial:    7.94 cm/s LV PW:         1.00 cm     LV E/e' medial:  9.5 LV IVS:        1.00  cm     LV e' lateral:   10.00 cm/s LVOT diam:     2.00 cm     LV E/e' lateral: 7.5 LV SV:         31 LV SV Index:   17 LVOT Area:     3.14 cm  LV Volumes (MOD) LV vol d, MOD A4C: 62.7 ml LV vol s, MOD A4C: 28.2 ml LV SV MOD A4C:     62.7 ml RIGHT VENTRICLE RV Basal diam:  3.50 cm RV S prime:     4.68 cm/s TAPSE (M-mode): 1.9 cm LEFT ATRIUM           Index       RIGHT ATRIUM            Index LA diam:      4.10 cm 2.32 cm/m  RA Area:     21.50 cm LA Vol (A4C): 50.6 ml 28.65 ml/m RA Volume:   74.50 ml  42.18 ml/m  AORTIC VALVE LVOT Vmax:   59.40 cm/s LVOT Vmean:  37.000 cm/s LVOT VTI:    0.097 m  AORTA Ao Root diam: 3.30 cm MITRAL VALVE               TRICUSPID VALVE MV Area (PHT): 5.02 cm    TR Peak grad:   30.9 mmHg MV Decel Time: 151 msec    TR Vmax:        278.00 cm/s MV E velocity: 75.40 cm/s MV A velocity: 21.40 cm/s  SHUNTS MV E/A ratio:  3.52        Systemic VTI:  0.10 m                            Systemic Diam: 2.00 cm Dorris Carnes MD Electronically signed by Dorris Carnes MD Signature Date/Time: 08/18/2020/5:50:17 PM    Final     Cardiac Studies   Echo 08/18/20 1. Endocardium is difficult to see. Overall LVEF is probably moderately depressed with hypokineis of the septum, anterior, inferior and apical wall segments Would recomm limited echo with Definity to further define wall motion and LVEF. The left ventricle demonstrates global hypokinesis. Left ventricular diastolic parameters are indeterminate.  2. Right ventricular systolic function is low normal. The right ventricular size is normal. There is normal pulmonary artery systolic pressure.  3. Right atrial size was severely dilated.  4. Mild mitral valve regurgitation.  5. The aortic valve is tricuspid. Aortic valve regurgitation is trivial.  6. The inferior vena cava is dilated in size with <50% respiratory variability, suggesting right atrial pressure of 15 mmHg.  Patient Profile     87 y.o. female with PMH atrial fib/flutter, tachy-mediated cardiomyopathy, prior PE/DVT who presented with shortness of breath, hypotension, atrial fibrillation with RVR  Assessment & Plan    Right upper lobe pneumonia with hypoxic respiratory failure and septic shock, requiring intubation and pressors -management per PCCM -extubated 08/19/20  Atrial fibrillation with RVR History of tachycardia-mediated  cardiomyopathy -echo with difficult images but likely cardiomyopathy, see below -would avoid diltiazem given history of low EF -continue amiodarone, apixaban -started on digoxin this admission as well -now rate controlled. When BP improves consistently, will trial metoprolol  History of hypotension, with shock requiring pressors this admission -holding diltiazem, losartan, metoprolol, spironolactone given hypotension  Possible cardiomyopathy: -would repeat limited echo this admission with definity once nearing recovery   For questions or updates, please contact [place] Please  consult [email or web address] for contact info under        Signed, Buford Dresser, MD  08/19/2020, 12:15 PM

## 2020-08-19 NOTE — Procedures (Signed)
Extubation Procedure Note  Patient Details:   Name: Katherine Ewing DOB: 04-20-1931 MRN: [id number]   Airway Documentation:  AIRWAYS (Active)     AIRWAYS (Active)   Vent end date: 08/19/20 Vent end time: 0933   Evaluation  O2 sats: stable throughout Complications: No apparent complications Patient did tolerate procedure well. Bilateral Breath Sounds: Clear   Yes   Pt was extubated per CCM order and placed on 3 L [place] without complications. Cuff leak was noted prior to extubation and no stridor heard post. Pt has stable vitals. RT will monitor.   Merlene Laughter 08/19/2020, 9:37 AM

## 2020-08-19 NOTE — Progress Notes (Signed)
Rehab Admissions Coordinator Note:  Patient was screened by Clois Dupes for appropriateness for an Inpatient Acute Rehab Consult per therapy recommendation. Noted extubated today with limited assessment today. I await further progress with therapy before placing a rehab consult.    Clois Dupes RN MSN 08/19/2020, 2:20 PM  I can be reached at [phone].

## 2020-08-19 NOTE — Progress Notes (Signed)
NAME:  Katherine Ewing, MRN:  [id number], DOB:  05-04-1931, LOS: 6 ADMISSION DATE:  08/24/2020, CONSULTATION DATE:  08/15/20 REFERRING MD:  Nelson Chimes CHIEF COMPLAINT:  SOB   History of Present Illness:  87 yo female presented with malaise, myalgias, cough, and decreased appetite.  Found to have Rt upper lobe CAP and A fib with RVR and hypotension.  Developed respiratory distress with hypoxia and altered mental status on 3/18.  She required intubation, pressors, and transfer to ICU.  Pertinent  Medical History  Paroxysmal atrial fibrillation, Chronic systolic CHF, HTN, HLD. Hypothyroidism  Significant Hospital Events: Including procedures, antibiotic start and stop dates in addition to other pertinent events   3/16 admit 3/17 cardiology consulted 3/18 transfer to ICU, intubated, start pressors 3/19 start amiodarone 3/20 off pressors temporarily 3/22 off pressors, extubated  Interim History / Subjective:   Pt more alert this morning though appears confused. Does not recognize her husband at bedside which he states is abnormal. Remains on 1.2 mcg Precedex. Tolerated PSV well yesterday and this morning. RASS 0 UOP 2.2 L last 24 hours  Objective   Blood pressure (!) 97/53, pulse 77, temperature 97.6 F (36.4 C), temperature source Axillary, resp. rate (!) 23, height 5\' 2"  (1.575 m), weight 74.8 kg, SpO2 98 %.    Vent Mode: PSV;CPAP FiO2 (%):  [40 %] 40 % Set Rate:  [18 bmp] 18 bmp Vt Set:  [350 mL] 350 mL PEEP:  [5 cmH20] 5 cmH20 Pressure Support:  [10 cmH20] 10 cmH20 Plateau Pressure:  [14 cmH20-21 cmH20] 21 cmH20   Intake/Output Summary (Last 24 hours) at 08/19/2020 0909 Last data filed at 08/19/2020 0800 Gross per 24 hour  Intake 1962.83 ml  Output 2300 ml  Net -337.17 ml   Filed Weights   08/17/20 0430 08/18/20 0329 08/19/20 0500  Weight: 75.9 kg 75.3 kg 74.8 kg    Examination:  General - acutely ill appearing female, intubated, alert, NAD HENT: anicteric sclera, MMM, ETT in  place, OG tube Cardiac: tachycardic, irregularly irregular rate, normal S1S2 Chest: faint bilateral ronchi R>L, symmetric chest rise, mechanically ventilated Abdomen: soft, non-tender, non-distended Extremities: 1+ edema bilateral lower extremities Skin: warm, well perfused, no rashes, bruising around right wrist restraint Neuro: RASS 0, awake upon entering room, appears confused and unable to follow commands   Labs/imaging personally reviewed    CMP Latest Ref Rng & Units 08/19/2020 08/18/2020 08/17/2020  Glucose 70 - 99 mg/dL 08/19/2020) 258(N) 277(O)  BUN 8 - 23 mg/dL 242(P) 22 17  Creatinine 0.44 - 1.00 mg/dL 53(I 1.44 3.15  Sodium 135 - 145 mmol/L 137 136 136  Potassium 3.5 - 5.1 mmol/L 3.7 3.9 3.2(L)  Chloride 98 - 111 mmol/L 105 106 109  CO2 22 - 32 mmol/L 25 23 23   Calcium 8.9 - 10.3 mg/dL 7.8(L) 7.7(L) 7.7(L)  Total Protein 6.5 - 8.1 g/dL - - -  Total Bilirubin 0.3 - 1.2 mg/dL - - -  Alkaline Phos 38 - 126 U/L - - -  AST 15 - 41 U/L - - -  ALT 0 - 44 U/L - - -    CBC Latest Ref Rng & Units 08/19/2020 08/18/2020 08/17/2020  WBC 4.0 - 10.5 K/uL 6.0 6.7 5.8  Hemoglobin 12.0 - 15.0 g/dL 08/20/2020) 08/19/2020) 8.6(P)  Hematocrit 36.0 - 46.0 % 26.9(L) 28.4(L) 27.4(L)  Platelets 150 - 400 K/uL 187 204 170    ABG    Component Value Date/Time   PHART 7.437 08/15/2020 1033   PCO2ART  34.0 08/15/2020 1033   PO2ART 245 (H) 08/15/2020 1033   HCO3 23.0 08/15/2020 1033   TCO2 24 08/15/2020 1033   ACIDBASEDEF 1.0 08/15/2020 1033   O2SAT 100.0 08/15/2020 1033    CBG (last 3)  Recent Labs    08/19/20 0012 08/19/20 0324 08/19/20 0747  GLUCAP 117* 130* 125*    Resolved Hospital Problem list   Hypotension from sepsis and hypovolemia  Assessment & Plan:   Acute hypoxic respiratory failure from Rt upper lobe community acquired pneumonia. Pulmonary Edema - ETT 3/18 >>3/22 - plan to extubate this morning - Bipap PRN - supplemental O2 for SpO2<92% - ST swallow study - Ceftriaxone, day 7  of 7  A.fib with RVR with history of PAF. Hx HTN, HLD, chronic systolic CHF, DVT with PE. - echo 3/21 with moderate LVEF depression and hypokinesis - continue eliquis, lipitor - digoxin and amiodarone, per cardiology - ST for swallow study before anything PO - hold Lasix as patient is off pressors with improving respiratory status, consider diuresis tomorrow  Hypokalemia. - K 3.7 this AM, replete pending swallow study - serial BMP  Hx Hypothyroidism. - Continue home synthroid  Anemia of critical illness. - stable at 8.9 this AM - transfuse for Hb < 7 or significant bleeding  Acute metabolic encephalopathy from sepsis, hypoxia. Essential tremor. - RASS goal 0 to -1 - primidone dose decreased on 3/18 - f/u orientation this afternoon after precedex is d/c'd  Urine retention. - foley placed by urology on 3/18 - keep foley in for now  Constipation - no bowel movement since 3/16 - Miralax and Docusate BID  Best practice (evaluated daily)  Diet:  Tube Feed  Pain/Anxiety/Delirium protocol (if indicated): Yes (RASS goal 0) VAP protocol (if indicated): Yes DVT prophylaxis: Systemic AC GI prophylaxis: PPI Glucose control:  SSI No Central venous access:  Yes, and it is still needed Arterial line:  N/A Foley:  Yes, and it is still needed Mobility:  bed rest  PT consulted: N/A Last date of multidisciplinary goals of care discussion None Code Status:  full code Disposition: ICU   Jori Moll, Medical Student 08/19/2020 , 9:09 AM

## 2020-08-19 NOTE — Evaluation (Signed)
Physical Therapy Evaluation Patient Details Name: Katherine Ewing MRN: [id number] DOB: [birth year] Today's Date: 08/19/2020   History of Present Illness  87 y.o. female presenting to [facility] ED on 09/07/2020 with 3 days of fatigue, chills, cough. Pt found to be tachy and hypotensive in ED. CXR in ED showed RUL PNA. 3/18 pt with pulmonary edema with intubation, extubated 3/22. PMH includes HTN, MV prolapse, PAF on eliquis, chronic syst CHF (last EF 45-50% 05/2019), hypothyroidism, thrombocytopenia.  Clinical Impression  Pt initially with flat affect and limited verbalization on arrival. With cues pt able to attempt to verbalize and stating name and reading therapist name tag. Pt with incontinent stool throughout session significantly limiting ability to mobilize at this time with total assist for linen change and pericare. Pt positioned in chair position end of session with increased interraction with spouse holding his hand and stating "thank you". Pt with decreased cognition, balance, strength, and  functional mobility who will benefit from acute therapy to maximize mobility and independence to decrease burden of care. Pt was moving well PTA and anticipate she will progress with therapy but need additional assist prior to return home with spouse.   SpO2 with drop to 77% on 3L during mobility with inconsistent pleth and O2 bumped to 5L with sats 88-95%, at rest end of session with pulse ox probe 95% on 4L BP 130/73 (90) HR 87    Follow Up Recommendations CIR;Supervision/Assistance - 24 hour    Equipment Recommendations  Other (comment) (TBD with progression)    Recommendations for Other Services OT consult     Precautions / Restrictions Precautions Precautions: Fall Precaution Comments: incontinent stool, watch sats      Mobility  Bed Mobility Overal bed mobility: Needs Assistance Bed Mobility: Rolling Rolling: Min assist         General bed mobility comments: bil rolling x 2 for pericare  and linen change. cues for sequence with increased time to bend knee and reach across body to roll. Supine to sit with total assist of bed function into sitting. Pt able to bring chest off the surface with rail and cues with min assist    Transfers                 General transfer comment: unable to attempt due to cognition and incontinent stool  Ambulation/Gait                Stairs            Wheelchair Mobility    Modified Rankin (Stroke Patients Only)       Balance                                             Pertinent Vitals/Pain Pain Assessment: No/denies pain    Home Living Family/patient expects to be discharged to:: Private residence Living Arrangements: Spouse/significant other Available Help at Discharge: Family;Available 24 hours/day Type of Home: House Home Access: Stairs to enter Entrance Stairs-Rails: None Entrance Stairs-Number of Steps: 1 Home Layout: One level Home Equipment: Walker - 2 wheels;Cane - single point      Prior Function Level of Independence: Independent         Comments: pt ambulates for household distances     Hand Dominance        Extremity/Trunk Assessment   Upper Extremity Assessment Upper Extremity Assessment: Generalized weakness  Lower Extremity Assessment Lower Extremity Assessment: Generalized weakness (bil LE edema)    Cervical / Trunk Assessment Cervical / Trunk Assessment: Kyphotic  Communication   Communication: HOH;Expressive difficulties (pt post extubation with hoarse voice)  Cognition Arousal/Alertness: Awake/alert Behavior During Therapy: Restless Overall Cognitive Status: Impaired/Different from baseline Area of Impairment: Orientation;Attention;Memory;Following commands;Safety/judgement;Awareness;Problem solving                 Orientation Level: Disoriented to;Time;Situation Current Attention Level: Sustained Memory: Decreased short-term  memory Following Commands: Follows one step commands inconsistently;Follows one step commands with increased time Safety/Judgement: Decreased awareness of safety;Decreased awareness of deficits Awareness: Intellectual Problem Solving: Slow processing General Comments: pt incontinent of stool and aware with ability to state she was voiding. With bil LE HEP required multimodal cues and increased time. Anxious about bathroom door being open. RN reports pt reporting hallucinating that someone under the bed      General Comments      Exercises General Exercises - Lower Extremity Short Arc Quad: AROM;Both;5 reps;Seated Hip Flexion/Marching: AAROM;Both;5 reps;Seated   Assessment/Plan    PT Assessment Patient needs continued PT services  PT Problem List Decreased strength;Decreased activity tolerance;Decreased balance;Decreased mobility;Decreased safety awareness;Decreased knowledge of precautions;Decreased knowledge of use of DME;Cardiopulmonary status limiting activity;Decreased cognition;Decreased coordination       PT Treatment Interventions DME instruction;Gait training;Stair training;Functional mobility training;Therapeutic activities;Balance training;Neuromuscular re-education;Patient/family education;Therapeutic exercise;Cognitive remediation    PT Goals (Current goals can be found in the Care Plan section)  Acute Rehab PT Goals Patient Stated Goal: be able to walk and return home PT Goal Formulation: With patient/family Time For Goal Achievement: 09/02/20 Potential to Achieve Goals: Fair    Frequency Min 3X/week   Barriers to discharge Decreased caregiver support      Co-evaluation               AM-PAC PT "6 Clicks" Mobility  Outcome Measure Help needed turning from your back to your side while in a flat bed without using bedrails?: A Little Help needed moving from lying on your back to sitting on the side of a flat bed without using bedrails?: A Lot Help needed  moving to and from a bed to a chair (including a wheelchair)?: Total Help needed standing up from a chair using your arms (e.g., wheelchair or bedside chair)?: Total Help needed to walk in hospital room?: Total Help needed climbing 3-5 steps with a railing? : Total 6 Click Score: 9    End of Session Equipment Utilized During Treatment: Oxygen Activity Tolerance: Patient tolerated treatment well Patient left: in bed;with call bell/phone within reach;with family/visitor present;with nursing/sitter in room (alarm not set as Rn to place rectal pouch) Nurse Communication: Mobility status PT Visit Diagnosis: Unsteadiness on feet (R26.81);Other abnormalities of gait and mobility (R26.89);Muscle weakness (generalized) (M62.81)    Time: 6073-7106 PT Time Calculation (min) (ACUTE ONLY): 44 min   Charges:   PT Evaluation $PT Eval Moderate Complexity: 1 Mod PT Treatments $Therapeutic Activity: 23-37 mins        Lanna Labella P, PT Acute Rehabilitation Services Pager: [phone] Office: [phone]   Seaton Hofmann B Maybelline [place]ik 08/19/2020, 1:16 PM

## 2020-08-20 ENCOUNTER — Inpatient Hospital Stay (HOSPITAL_COMMUNITY): Payer: Medicare HMO

## 2020-08-20 DIAGNOSIS — I5022 Chronic systolic (congestive) heart failure: Secondary | ICD-10-CM

## 2020-08-20 DIAGNOSIS — E861 Hypovolemia: Secondary | ICD-10-CM | POA: Diagnosis not present

## 2020-08-20 DIAGNOSIS — J189 Pneumonia, unspecified organism: Secondary | ICD-10-CM | POA: Diagnosis not present

## 2020-08-20 DIAGNOSIS — J9601 Acute respiratory failure with hypoxia: Secondary | ICD-10-CM | POA: Diagnosis not present

## 2020-08-20 DIAGNOSIS — I4891 Unspecified atrial fibrillation: Secondary | ICD-10-CM | POA: Diagnosis not present

## 2020-08-20 DIAGNOSIS — I9589 Other hypotension: Secondary | ICD-10-CM | POA: Diagnosis not present

## 2020-08-20 LAB — BASIC METABOLIC PANEL
Anion gap: 6 (ref 5–15)
BUN: 23 mg/dL (ref 8–23)
CO2: 27 mmol/L (ref 22–32)
Calcium: 8 mg/dL — ABNORMAL LOW (ref 8.9–10.3)
Chloride: 106 mmol/L (ref 98–111)
Creatinine, Ser: 0.66 mg/dL (ref 0.44–1.00)
GFR, Estimated: >60 mL/min (ref >60)
Glucose, Bld: 130 mg/dL — ABNORMAL HIGH (ref 70–99)
Potassium: 4.1 mmol/L (ref 3.5–5.1)
Sodium: 139 mmol/L (ref 135–145)

## 2020-08-20 LAB — GLUCOSE, CAPILLARY
Glucose-Capillary: 140 mg/dL — ABNORMAL HIGH (ref 70–99)
Glucose-Capillary: 80 mg/dL (ref 70–99)
Glucose-Capillary: 85 mg/dL (ref 70–99)
Glucose-Capillary: 96 mg/dL (ref 70–99)
Glucose-Capillary: 90 mg/dL (ref 70–99)

## 2020-08-20 LAB — CBC
HCT: 27.5 % — ABNORMAL LOW (ref 36.0–46.0)
Hemoglobin: 8.7 g/dL — ABNORMAL LOW (ref 12.0–15.0)
MCH: 31.2 pg (ref 26.0–34.0)
MCHC: 31.6 g/dL (ref 30.0–36.0)
MCV: 98.6 fL (ref 80.0–100.0)
Platelets: 205 10*3/uL (ref 150–400)
RBC: 2.79 MIL/uL — ABNORMAL LOW (ref 3.87–5.11)
RDW: 15.6 % — ABNORMAL HIGH (ref 11.5–15.5)
WBC: 7.9 10*3/uL (ref 4.0–10.5)
nRBC: 0 % (ref 0.0–0.2)

## 2020-08-20 MED ORDER — HYDROXYZINE HCL 10 MG PO TABS
10.0000 mg | ORAL_TABLET | Freq: Three times a day (TID) | ORAL | Status: DC | PRN
  Administered 2020-08-20 – 2020-08-23 (×4): 10 mg via ORAL
  Filled 2020-08-20 (×6): qty 1

## 2020-08-20 MED ORDER — PANTOPRAZOLE SODIUM 40 MG PO TBEC
40.0000 mg | DELAYED_RELEASE_TABLET | Freq: Every day | ORAL | Status: DC
  Filled 2020-08-20: qty 1

## 2020-08-20 MED ORDER — STARCH (THICKENING) PO POWD: ORAL | Status: DC | PRN

## 2020-08-20 MED ORDER — KETOROLAC TROMETHAMINE 15 MG/ML IJ SOLN
15.0000 mg | Freq: Once | INTRAMUSCULAR | Status: AC
  Administered 2020-08-20: 15 mg via INTRAVENOUS
  Filled 2020-08-20: qty 1

## 2020-08-20 MED ORDER — TRAMADOL HCL 50 MG PO TABS
50.0000 mg | ORAL_TABLET | Freq: Two times a day (BID) | ORAL | Status: DC | PRN
  Administered 2020-08-21 – 2020-08-22 (×2): 50 mg via ORAL
  Filled 2020-08-20 (×2): qty 1

## 2020-08-20 MED ORDER — RESOURCE THICKENUP CLEAR PO POWD
ORAL | Status: DC | PRN
  Filled 2020-08-20: qty 125

## 2020-08-20 MED ORDER — FUROSEMIDE 10 MG/ML IJ SOLN
40.0000 mg | Freq: Once | INTRAMUSCULAR | Status: AC
  Administered 2020-08-20: 40 mg via INTRAVENOUS
  Filled 2020-08-20: qty 4

## 2020-08-20 MED ORDER — MELATONIN 3 MG PO TABS
3.0000 mg | ORAL_TABLET | Freq: Every day | ORAL | Status: DC
Start: 2020-08-20 — End: 2020-08-23
  Administered 2020-08-20 – 2020-08-21 (×2): 3 mg via ORAL
  Filled 2020-08-20 (×3): qty 1

## 2020-08-20 NOTE — Progress Notes (Signed)
Inpatient Rehabilitation Admissions Coordinator  Inpatient rehab consult received I met at bedside with patient , spouse and Nurse. Spouse unable to hear and patient also very hard of hearing. I await further progress and then will return to speak with them concerning her rehab venue options. I have concerns of additional assistance needed for this couple at home.  Danne Baxter, RN, MSN Rehab Admissions Coordinator [phone] 08/20/2020 12:18 PM

## 2020-08-20 NOTE — Progress Notes (Signed)
Modified Barium Swallow Progress Note  Patient Details  Name: Coletta Lockner MRN: [id number] Date of Birth: 05/11/1931  Today's Date: 08/20/2020  Modified Barium Swallow completed.  Full report located under Chart Review in the Imaging Section.  Brief recommendations include the following:  Clinical Impression  Pt presents with oropharyngeal dysphagia characterized by impaired mastication, reduced posterior bolus propulsion, a pharyngeal delay, and reduced anterior laryngeal movement. She demonstrated lingual pumping, prolonged mastication of regular textures, penetration (PAS 5) and aspiration (PAS 7,8) of thin liquids. Laryngeal invasion was secondary to pharyngeal delay and was less frequently noted with thin liquids via cup. Pt required encouragement to participate in the study and exhibited difficulty demonstrating compensatory strategies. She frequently stated, "That's not the medicine I take" despite education regarding the purpose of the study. A dysphagia 2 diet with nectar thick liquids is recommended at this time. SLP will follow for dysphagia treatment.   Swallow Evaluation Recommendations       SLP Diet Recommendations: Dysphagia 2 (Fine chop) solids;Nectar thick liquid   Liquid Administration via: Cup;Straw   Medication Administration: Whole meds with liquid   Supervision: Staff to assist with self feeding   Compensations: Small sips/bites;Slow rate;Minimize environmental distractions   Postural Changes: Seated upright at 90 degrees   Oral Care Recommendations: Oral care BID   Other Recommendations: Order thickener from pharmacy  Normandy I. Vear Clock, MS, CCC-SLP Acute Rehabilitation Services Office number [phone] Pager [phone]   Scheryl Marten 08/20/2020,12:05 PM

## 2020-08-20 NOTE — Progress Notes (Signed)
  Speech Language Pathology Treatment: Dysphagia  Patient Details Name: Katherine Ewing MRN: [id number] DOB: Jan 31, 1931 Today's Date: 08/20/2020 Time: 6222-9798 SLP Time Calculation (min) (ACUTE ONLY): 13 min  Assessment / Plan / Recommendation Clinical Impression  Pt was seen for dysphagia treatment with her husband present. Pt's husband was unable to provide any information regarding the pt's baseline voice despite multiple questions from SLP. Pt's vocal quality remains severely dysphonic with periods of aphonia, suggesting continued vocal fold insufficiency and increased aspiration risk. Pt tolerated ice chips without overt s/sx of aspiration, but demonstrated delayed coughing with thin liquids via cup and straw. Pt refused all other consistencies stating that "they don't give me anything". Considering pt's prolonged intubation and continued dysphonia, SLP recommends a modified barium swallow study to further assess physiology. It is currently scheduled for today at 1030. However, her participation therein is questioned.    HPI HPI: 87 y.o. female presented to [facility] ED on 09-04-20 with 3 days of fatigue, chills, cough. Pt found to be tachy and hypotensive in ED. CXR in ED showed RUL PNA.  No hx of dysphagia per pt/husband at that time. Underwent clinical swallow evaluation on 3/17, no concerns for dysphagia. Oral-motor examination was unremarkable with intact dentition. Self administered multiple sips water via cup/straw and regular texture without indications of aspiration. Respirations were normal without audible congestion. Therapist upgraded texture from clears to regular, thin liquids. No further ST f/u was recommended. Overnight and morning of 3/18 she became obtunded with severe respiratory distress requiring intubation was transferred to the ICU.  Extubated 3/22. PMH includes HTN, MV prolapse, PAF on eliquis, chronic syst CHF, hypothyroidism, thrombocytopenia.      SLP Plan  MBS        Recommendations  Diet recommendations: NPO Medication Administration: Crushed with puree                Oral Care Recommendations: Oral care QID Follow up Recommendations: Other (comment) (tba) SLP Visit Diagnosis: Dysphagia, oropharyngeal phase (R13.12) Plan: MBS       Shanika I. Vear Clock, MS, CCC-SLP Acute Rehabilitation Services Office number [phone] Pager [phone]                Scheryl Marten 08/20/2020, 9:25 AM

## 2020-08-20 NOTE — Progress Notes (Signed)
Inpatient Rehabilitation Admissions Coordinator  Patient more participatory with OT eval today. Recommend Rehab consult to assess if she would be a candidate for CIR Admit. Please place rehab consult order.   Ottie Glazier, RN, MSN Rehab Admissions Coordinator [phone] 08/20/2020 11:16 AM

## 2020-08-20 NOTE — Progress Notes (Signed)
NAME:  Katherine Ewing, MRN:  [id number], DOB:  03/15/1931, LOS: 7 ADMISSION DATE:  09/01/20, CONSULTATION DATE:  08/15/20 REFERRING MD:  Nelson Chimes CHIEF COMPLAINT:  SOB   History of Present Illness:  87 yo female presented with malaise, myalgias, cough, and decreased appetite.  Found to have Rt upper lobe CAP and A fib with RVR and hypotension.  Developed respiratory distress with hypoxia and altered mental status on 3/18.  She required intubation, pressors, and transfer to ICU.  Pertinent  Medical History  Paroxysmal atrial fibrillation, Chronic systolic CHF, HTN, HLD. Hypothyroidism  Significant Hospital Events: Including procedures, antibiotic start and stop dates in addition to other pertinent events   3/16 admit 3/17 cardiology consulted 3/18 transfer to ICU, intubated, start pressors 3/19 start amiodarone 3/20 off pressors temporarily 3/22 off pressors, extubated 3/23 improving mental status  Interim History / Subjective:   Pt became agitated and experienced tachypnea and drop in her O2 sat to high 80s yesterday after extubation. Pt refused Bipap. She improved with Precedex and supplemental O2. Has had some tachypnea since but maintains SpO2 in 90s on 5 L [place].  Pt reports some chest pain and palpitation overnight. She was tachycardic and agitated overnight requiring Precedex. Pt more alert this morning though remains confused. Oriented to herself. Recognizes her husband at bedside this morning. Sating well on 5L [place] RASS 0 UOP 2.2 L last 24 hours  Objective   Blood pressure (!) 85/51, pulse 86, temperature 97.6 F (36.4 C), temperature source Oral, resp. rate (!) 23, height 5\' 2"  (1.575 m), weight 74.8 kg, SpO2 94 %.        Intake/Output Summary (Last 24 hours) at 08/20/2020 0837 Last data filed at 08/20/2020 0600 Gross per 24 hour  Intake 1546.67 ml  Output 2080 ml  Net -533.33 ml   Filed Weights   08/17/20 0430 08/18/20 0329 08/19/20 0500  Weight: 75.9 kg 75.3 kg 74.8 kg     Examination:  General - acutely ill appearing female, alert, NAD HENT: anicteric sclera, MMM, soft, faint voice Cardiac: regular rate, irregularly irregular rhythm, normal S1S2 Chest: improving faint bilateral ronchi R>L, normal respiratory effort without accessory muscle use Abdomen: soft, non-tender, non-distended, NABS Extremities: trace edema bilateral lower extremities Skin: warm, well perfused, no rashes, bruising around right wrist Neuro: RASS 0, awake upon entering room, able to follow commands and converse on 0.4 mcg Precedex, recognizes her husband this AM   Labs/imaging personally reviewed    CMP Latest Ref Rng & Units 08/20/2020 08/19/2020 08/18/2020  Glucose 70 - 99 mg/dL 08/20/2020) 892(J) 194(R)  BUN 8 - 23 mg/dL 23 740(C) 22  Creatinine 0.44 - 1.00 mg/dL 14(G 8.18 5.63  Sodium 135 - 145 mmol/L 139 137 136  Potassium 3.5 - 5.1 mmol/L 4.1 3.7 3.9  Chloride 98 - 111 mmol/L 106 105 106  CO2 22 - 32 mmol/L 27 25 23   Calcium 8.9 - 10.3 mg/dL 8.0(L) 7.8(L) 7.7(L)  Total Protein 6.5 - 8.1 g/dL - - -  Total Bilirubin 0.3 - 1.2 mg/dL - - -  Alkaline Phos 38 - 126 U/L - - -  AST 15 - 41 U/L - - -  ALT 0 - 44 U/L - - -    CBC Latest Ref Rng & Units 08/20/2020 08/19/2020 08/18/2020  WBC 4.0 - 10.5 K/uL 7.9 6.0 6.7  Hemoglobin 12.0 - 15.0 g/dL 08/21/2020) 8.9(L) 9.0(L)  Hematocrit 36.0 - 46.0 % 27.5(L) 26.9(L) 28.4(L)  Platelets 150 - 400 K/uL 205 187  204    ABG    Component Value Date/Time   PHART 7.437 08/15/2020 1033   PCO2ART 34.0 08/15/2020 1033   PO2ART 245 (H) 08/15/2020 1033   HCO3 23.0 08/15/2020 1033   TCO2 24 08/15/2020 1033   ACIDBASEDEF 1.0 08/15/2020 1033   O2SAT 100.0 08/15/2020 1033    CBG (last 3)  Recent Labs    08/19/20 2358 08/20/20 0510 08/20/20 0755  GLUCAP 91 140* 80    Resolved Hospital Problem list   Hypotension from sepsis and hypovolemia  Assessment & Plan:   Acute hypoxic respiratory failure from Rt upper lobe community acquired  pneumonia and acute pulmonary edema - extubated 3/22 - completed 7 day course Ceftriaxone - Bipap PRN - supplemental O2 for goal SpO2>92% - lasix 40 mg, follow BP and heart rate closely - f/u CXR  A.fib with RVR Acute on Chronic CHF - cardiology following, appreciate recs - echo 3/21 with moderate LVEF depression and hypokinesis - continue eliquis, lipitor - digoxin and amiodarone, per cardiology - hold Lopressor today per Cardiology for soft BPs - telemetry  Chest Pain - left sided chest pain overnight with increased oxygen demand - pt on systemic anticoagulation with eliquis - f/u CXR  Mild Delirium - mildly confused with sleep-wake disruption - husband at bedside, frequent orientation, lights on during the day, minimize cords and tubing on pt - plan to remove central line today - wean off Precedex as tolerated today - melatonin 3mg  QHS  Dysphagia - ST assessment - PO thickened liquids  Deconditioning - PT/OT - Pt would do well with inpatient rehab prior to d/c home  Hypokalemia - K 4.1, goal >4 - f/u BMP  Hx Hypothyroidism - Continue home synthroid  Anemia of critical illness - stable at 8.7 this AM - transfuse for Hb < 7 or significant bleeding  Essential tremor - RASS goal 0 to -1 - primidone dose decreased on 3/18 - wean precedex as tolerated  Urine retention - remove foley tomorrow if improved mental status  Diarrhea -stool softener PRN  Best practice (evaluated daily)  Diet: Thickened Liquids Pain/Anxiety/Delirium protocol (if indicated): No VAP protocol (if indicated): Not indicated DVT prophylaxis: Systemic AC GI prophylaxis: N/A Glucose control:  SSI No Central venous access:  Yes, to be removed today Arterial line:  N/A Foley:  Yes, and it is still needed Mobility:  bed rest  PT consulted: Yes Last date of multidisciplinary goals of care discussion None Code Status:  full code Disposition: ICU   4/18, Medical  Student 08/20/2020 , 8:37 AM

## 2020-08-20 NOTE — Progress Notes (Signed)
Progress Note  Patient Name: Katherine Ewing Date of Encounter: 08/20/2020  [facility] Cardiologist: Smitty [facility]  Subjective   Has some agitation overnight. Calmer on my exam, husband at bedside.  Inpatient Medications    Scheduled Meds: . apixaban  5 mg Oral BID  . atorvastatin  20 mg Oral Daily  . Chlorhexidine Gluconate Cloth  6 each Topical Daily  . digoxin  0.125 mg Oral Daily  . dorzolamide-timolol  1 drop Both Eyes BID  . latanoprost  1 drop Both Eyes QHS  . levothyroxine  88 mcg Oral q morning  . mouth rinse  15 mL Mouth Rinse BID  . melatonin  3 mg Oral QHS  . metoprolol tartrate  12.5 mg Oral BID  . primidone  250 mg Oral BID  . sodium chloride flush  10-40 mL Intracatheter Q12H   Continuous Infusions: . sodium chloride 10 mL/hr at 08/20/20 0600  . sodium chloride    . amiodarone 30 mg/hr (08/20/20 0600)  . dexmedetomidine (PRECEDEX) IV infusion 0.5 mcg/kg/hr (08/20/20 0600)  . norepinephrine (LEVOPHED) Adult infusion Stopped (08/18/20 2232)   PRN Meds: sodium chloride, acetaminophen, metoprolol tartrate, polyethylene glycol, sennosides, sodium chloride flush   Vital Signs    Vitals:   08/20/20 0830 08/20/20 0900 08/20/20 0930 08/20/20 1000  BP: 102/74 105/81 98/62 108/65  Pulse: (!) 101 95 92 89  Resp: (!) 33 (!) 26 (!) 27 (!) 31  Temp:      TempSrc:      SpO2: 95% (!) 89% 93% 93%  Weight:      Height:        Intake/Output Summary (Last 24 hours) at 08/20/2020 1056 Last data filed at 08/20/2020 0600 Gross per 24 hour  Intake 1154.6 ml  Output 2080 ml  Net -925.4 ml   Last 3 Weights 08/19/2020 08/18/2020 08/17/2020  Weight (lbs) 164 lb 14.5 oz 166 lb 0.1 oz 167 lb 5.3 oz  Weight (kg) 74.8 kg 75.3 kg 75.9 kg      Telemetry    Atrial fibrillation with variable rate control- Personally Reviewed  ECG    No new since 08/16/20 - Personally Reviewed  Physical Exam   GEN: Well nourished, well developed in no acute distress HEENT: Normal, moist  mucous membranes NECK: No JVD CARDIAC: irregularly irregular rhythm, normal S1 and S2, no rubs or gallops. No murmur. VASCULAR: Radial and DP pulses 2+ bilaterally. No carotid bruits RESPIRATORY:  Coarse throughout ABDOMEN: Soft, non-tender, non-distended MUSCULOSKELETAL: Moves all 4 limbs independently SKIN: Warm and dry, no edema NEUROLOGIC:  Not completely oriented but calm, knows her husband is here. PSYCHIATRIC:  Normal affect   Labs    High Sensitivity Troponin:  No results for input(s): TROPONINIHS in the last 720 hours.    Chemistry Recent Labs  Lab 08/18/2020 1204 08/15/20 0327 08/18/20 0324 08/19/20 0334 08/20/20 0518  NA 135   < > 136 137 139  K 3.3*   < > 3.9 3.7 4.1  CL 103   < > 106 105 106  CO2 22   < > 23 25 27   GLUCOSE 87   < > 145* 152* 130*  BUN 18   < > 22 25* 23  CREATININE 1.05*   < > 0.76 0.68 0.66  CALCIUM 8.6*   < > 7.7* 7.8* 8.0*  PROT 6.2*  --   --   --   --   ALBUMIN 2.6*  --   --   --   --  AST 38  --   --   --   --   ALT 24  --   --   --   --   ALKPHOS 41  --   --   --   --   BILITOT 2.5*  --   --   --   --   GFRNONAA 51*   < > >60 >60 >60  ANIONGAP 10   < > 7 7 6    < > = values in this interval not displayed.     Hematology Recent Labs  Lab 08/18/20 0324 08/19/20 0334 08/20/20 0518  WBC 6.7 6.0 7.9  RBC 2.89* 2.76* 2.79*  HGB 9.0* 8.9* 8.7*  HCT 28.4* 26.9* 27.5*  MCV 98.3 97.5 98.6  MCH 31.1 32.2 31.2  MCHC 31.7 33.1 31.6  RDW 15.3 15.4 15.6*  PLT 204 187 205    BNP Recent Labs  Lab 08/14/20 0820  BNP 272.4*     DDimer No results for input(s): DDIMER in the last 168 hours.   Radiology    DG Chest Port 1 View  Result Date: 08/19/2020 CLINICAL DATA:  Respiratory failure. EXAM: PORTABLE CHEST 1 VIEW COMPARISON:  08/18/2020. FINDINGS: Endotracheal tube tip again noted 2 cm from the carina. NG tube noted with tip below left hemidiaphragm. Left central line stable position. Stable cardiomegaly. Bilateral  infiltrates/edema, right side greater than left again noted. Slight interim improvement. Bilateral small pleural effusions again noted. No pneumothorax. IMPRESSION: 1. Lines and tubes in stable position. 2. Stable cardiomegaly. 3. Bilateral infiltrates/edema, right side greater than left again noted. Slight interim improvement. Bilateral small pleural effusions again noted. Electronically Signed   By: 08/20/2020  Register   On: 08/19/2020 05:09   ECHOCARDIOGRAM COMPLETE  Result Date: 08/18/2020    ECHOCARDIOGRAM REPORT   Patient Name:   Katherine Ewing Date of Exam: 08/18/2020 Medical Rec #:  08/20/2020   Height:       62.0 in Accession #:    [id number]  Weight:       166.0 lb Date of Birth:  10-Jul-1930   BSA:          1.766 m Patient Age:    87 years    BP:           110/68 mmHg Patient Gender: F           HR:           106 bpm. Exam Location:  Inpatient Procedure: 2D Echo, Cardiac Doppler and Color Doppler Indications:    CHF  History:        Patient has no prior history of Echocardiogram examinations.                 CHF, Arrythmias:Atrial Fibrillation, Signs/Symptoms:Altered                 Mental Status; Risk Factors:Hypertension and Dyslipidemia.                 Pneumonia, resp, failure.  Sonographer:    10/18/1930 Referring Phys: Lavenia Atlas SOOD  Sonographer Comments: Echo performed with patient supine and on artificial respirator. IMPRESSIONS  1. Endocardium is difficult to see. Overall LVEF is probably moderately depressed with hypokineis of the septum, anterior, inferior and apical wall segments Would recomm limited echo with Definity to further define wall motion and LVEF. The left ventricle demonstrates global hypokinesis. Left ventricular diastolic parameters are indeterminate.  2. Right ventricular systolic function is low normal. The  right ventricular size is normal. There is normal pulmonary artery systolic pressure.  3. Right atrial size was severely dilated.  4. Mild mitral valve regurgitation.   5. The aortic valve is tricuspid. Aortic valve regurgitation is trivial.  6. The inferior vena cava is dilated in size with <50% respiratory variability, suggesting right atrial pressure of 15 mmHg. FINDINGS  Left Ventricle: Endocardium is difficult to see. Overall LVEF is probably moderately depressed with hypokineis of the septum, anterior, inferior and apical wall segments Would recomm limited echo with Definity to further define wall motion and LVEF. The  left ventricle demonstrates global hypokinesis. The left ventricular internal cavity size was normal in size. There is no left ventricular hypertrophy. Left ventricular diastolic parameters are indeterminate. Right Ventricle: The right ventricular size is normal. Right vetricular wall thickness was not assessed. Right ventricular systolic function is low normal. There is normal pulmonary artery systolic pressure. The tricuspid regurgitant velocity is 2.78 m/s, and with an assumed right atrial pressure of 3 mmHg, the estimated right ventricular systolic pressure is 33.9 mmHg. Left Atrium: Left atrial size was normal in size. Right Atrium: Right atrial size was severely dilated. Pericardium: There is no evidence of pericardial effusion. Mitral Valve: There is mild thickening of the mitral valve leaflet(s). Mild mitral valve regurgitation. Tricuspid Valve: The tricuspid valve is normal in structure. Tricuspid valve regurgitation is mild. Aortic Valve: The aortic valve is tricuspid. Aortic valve regurgitation is trivial. Pulmonic Valve: The pulmonic valve was grossly normal. Pulmonic valve regurgitation is not visualized. Aorta: The aortic root is normal in size and structure. Venous: The inferior vena cava is dilated in size with less than 50% respiratory variability, suggesting right atrial pressure of 15 mmHg.  LEFT VENTRICLE PLAX 2D LVIDd:         3.90 cm     Diastology LVIDs:         2.90 cm     LV e' medial:    7.94 cm/s LV PW:         1.00 cm     LV E/e'  medial:  9.5 LV IVS:        1.00 cm     LV e' lateral:   10.00 cm/s LVOT diam:     2.00 cm     LV E/e' lateral: 7.5 LV SV:         31 LV SV Index:   17 LVOT Area:     3.14 cm  LV Volumes (MOD) LV vol d, MOD A4C: 62.7 ml LV vol s, MOD A4C: 28.2 ml LV SV MOD A4C:     62.7 ml RIGHT VENTRICLE RV Basal diam:  3.50 cm RV S prime:     4.68 cm/s TAPSE (M-mode): 1.9 cm LEFT ATRIUM           Index       RIGHT ATRIUM           Index LA diam:      4.10 cm 2.32 cm/m  RA Area:     21.50 cm LA Vol (A4C): 50.6 ml 28.65 ml/m RA Volume:   74.50 ml  42.18 ml/m  AORTIC VALVE LVOT Vmax:   59.40 cm/s LVOT Vmean:  37.000 cm/s LVOT VTI:    0.097 m  AORTA Ao Root diam: 3.30 cm MITRAL VALVE               TRICUSPID VALVE MV Area (PHT): 5.02 cm    TR Peak grad:  30.9 mmHg MV Decel Time: 151 msec    TR Vmax:        278.00 cm/s MV E velocity: 75.40 cm/s MV A velocity: 21.40 cm/s  SHUNTS MV E/A ratio:  3.52        Systemic VTI:  0.10 m                            Systemic Diam: 2.00 cm Dietrich Pates MD Electronically signed by Dietrich Pates MD Signature Date/Time: 08/18/2020/5:50:17 PM    Final     Cardiac Studies   Echo 08/18/20 1. Endocardium is difficult to see. Overall LVEF is probably moderately depressed with hypokineis of the septum, anterior, inferior and apical wall segments Would recomm limited echo with Definity to further define wall motion and LVEF. The left ventricle demonstrates global hypokinesis. Left ventricular diastolic parameters are indeterminate.  2. Right ventricular systolic function is low normal. The right ventricular size is normal. There is normal pulmonary artery systolic pressure.  3. Right atrial size was severely dilated.  4. Mild mitral valve regurgitation.  5. The aortic valve is tricuspid. Aortic valve regurgitation is trivial.  6. The inferior vena cava is dilated in size with <50% respiratory variability, suggesting right atrial pressure of 15 mmHg.  Patient Profile     87 y.o. female with  PMH atrial fib/flutter, tachy-mediated cardiomyopathy, prior PE/DVT who presented with shortness of breath, hypotension, atrial fibrillation with RVR  Assessment & Plan    Right upper lobe pneumonia with hypoxic respiratory failure and septic shock, requiring intubation and pressors -management per PCCM -extubated 08/19/20  Atrial fibrillation with RVR History of tachycardia-mediated cardiomyopathy -echo with difficult images but likely cardiomyopathy, see below -would avoid diltiazem given history of low EF -continue amiodarone, apixaban -started on digoxin this admission as well -variable HR control, suspect this is affected by agitation -BP remains intermittently low, no room for metoprolol today, will continue to monitor  History of hypotension, with shock requiring pressors this admission -holding diltiazem, losartan, metoprolol, spironolactone given hypotension  Possible cardiomyopathy: -would repeat limited echo this admission with definity once nearing recovery   For questions or updates, please contact [facility] Please consult [email or web address] for contact info under     Signed, Jodelle Red, MD  08/20/2020, 10:56 AM

## 2020-08-20 NOTE — Progress Notes (Signed)
Nutrition Follow-up  DOCUMENTATION CODES:   Not applicable  INTERVENTION:   - Mighty Shake TID with meals, each supplement provides 220 kcal and 6 grams of protein  - Magic Cup TID with meals, each supplement provides 290 kcal and 9 grams of protein  - Encourage adequate PO intake  NUTRITION DIAGNOSIS:   Inadequate oral intake related to inability to eat as evidenced by NPO status.  Progressing, diet advanced to dysphagia 2 with nectar-thick liquids  GOAL:   Patient will meet greater than or equal to 90% of their needs  Progressing  MONITOR:   Vent status,Labs,Weight trends,TF tolerance  REASON FOR ASSESSMENT:   Ventilator,Consult Enteral/tube feeding initiation and management  ASSESSMENT:   87 year old female who presented to the ED on 3/16 with cough and poor PO intake. PMH of HTN, MV prolapse, PAF, CHF, hypothyroidism, thrombocytopenia. Pt admitted with CAP, atrial fibrillation.  3/18 - intubated, transferred to ICU 3/22 - extubated 3/23 - MBS, diet advanced to dysphagia 2 with nectar-thick liquids  Discussed pt with RN and during ICU rounds. Diet advanced to dysphagia 2 with nectar-thick liquids today after MBS. RD will order appropriate supplements to aid pt in meeting kcal and protein needs.  Admit weight: 61 kg Current weight: 74.8 kg  Weight trending up. Pt with +1 pitting edema to BUE and BLE.  Medications reviewed and include: melatonin, amiodarone, precedex  Labs reviewed: hemoglobin 8.7 CBG's: 80-140 x 24 hours  UOP: 2115 ml x 24 hours I/O's: +7.3 L since admit  Diet Order:   Diet Order            DIET DYS 2 Room service appropriate? No; Fluid consistency: Nectar Thick  Diet effective now                 EDUCATION NEEDS:   No education needs have been identified at this time  Skin:  Skin Assessment: Reviewed RN Assessment  Last BM:  08/20/20  Height:   Ht Readings from Last 1 Encounters:  08/12/2020 5\' 2"  (1.575 m)    Weight:    Wt Readings from Last 1 Encounters:  08/19/20 74.8 kg    BMI:  Body mass index is 30.16 kg/m.  Estimated Nutritional Needs:   Kcal:  1650-1850  Protein:  75-90 grams  Fluid:  1.6-1.8 L    08/21/20, MS, RD, LDN Inpatient Clinical Dietitian Please see AMiON for contact information.

## 2020-08-20 NOTE — Progress Notes (Signed)
Occupational Therapy Evaluation Patient Details Name: Katherine Ewing MRN: [id number] DOB: Oct 14, 1930 Today's Date: 08/20/2020    History of Present Illness 87 y.o. female presenting [facility] ED on September 06, 2020 with 3 days of fatigue, chills, cough. Pt found to be tachy and hypotensive in ED. CXR in ED showed RUL PNA. 3/18 pt with pulmonary edema with intubation, extubated 3/22. PMH includes HTN, MV prolapse, PAF on eliquis, chronic syst CHF (last EF 45-50% 05/2019), hypothyroidism, thrombocytopenia.   Clinical Impression   PTA pt lives independently at home with her elderly husband. Pt was driving, cooking/cleaning and doing her own medication management. Pt demonstrates a significant functional decline requiring mod A + 2 with bed mobility, Max A +2 to stand and Max to total A with ADL tasks due to deficits listed below. VSS during session on 6L  -BP supine 98/62; sitting EOB 119/96; HR 81-111; BP 108/80 after session. Pt more interactive/verbal after session. Feel pt will benefit from intensive rehab at [facility] however will most likely need assistance from someone in addition to her husband after DC. Will follow acutely to maximize functional level of independence and facilitate DC to next venue of care.     Follow Up Recommendations  CIR;Supervision/Assistance - 24 hour    Equipment Recommendations  3 in 1 bedside commode    Recommendations for Other Services Rehab consult     Precautions / Restrictions Precautions Precautions: Fall Precaution Comments: foley; rectal foley      Mobility Bed Mobility Overal bed mobility: Needs Assistance Bed Mobility: Supine to Sit;Sit to Supine Rolling: Mod assist   Supine to sit: Mod assist;+2 for physical assistance Sit to supine: Mod assist;+2 for physical assistance   General bed mobility comments: pt assisting with pushing up into sitting and moving BLE off bed with assistance; bed pad utilized    Transfers Overall transfer level: Needs  assistance   Transfers: Sit to/from Stand Sit to Stand: Max assist;+2 physical assistance (unable to achieve full upright stature); gait belt/bed pad utilized              Balance     Sitting balance-Leahy Scale: Fair       Standing balance-Leahy Scale: Zero                             ADL either performed or assessed with clinical judgement   ADL Overall ADL's : Needs assistance/impaired Eating/Feeding: NPO   Grooming: Moderate assistance;Bed level   Upper Body Bathing: Maximal assistance;Sitting   Lower Body Bathing: Maximal assistance;Bed level   Upper Body Dressing : Maximal assistance   Lower Body Dressing: Total assistance;Bed level   Toilet Transfer: Total assistance   Toileting- Clothing Manipulation and Hygiene: Total assistance       Functional mobility during ADLs: +2 for physical assistance;Maximal assistance       Vision Baseline Vision/History: Wears glasses Additional Comments: s/p cataract surgery; wears glasses as needed     Perception     Praxis      Pertinent Vitals/Pain Pain Assessment: Faces Faces Pain Scale: Hurts little more Pain Location: generalized discomfort Pain Descriptors / Indicators: Discomfort;Grimacing;Guarding Pain Intervention(s): Limited activity within patient's tolerance     Hand Dominance Left   Extremity/Trunk Assessment Upper Extremity Assessment Upper Extremity Assessment: Generalized weakness;RUE deficits/detail;LUE deficits/detail RUE Deficits / Details: apparent contracture little finger; AAROM grossly WFL - affected by weakness. RUE Coordination: decreased fine motor;decreased gross motor LUE Deficits / Details: AAROM  grossly WFL - limited by weakness LUE Coordination: decreased fine motor;decreased gross motor   Lower Extremity Assessment Lower Extremity Assessment: Defer to PT evaluation   Cervical / Trunk Assessment Cervical / Trunk Assessment: Kyphotic;Other exceptions (forward  head)   Communication Communication Communication: HOH (very)   Cognition Arousal/Alertness: Awake/alert Behavior During Therapy: Restless;Flat affect Overall Cognitive Status: Impaired/Different from baseline Area of Impairment: Orientation;Attention;Memory;Following commands;Safety/judgement;Awareness;Problem solving                 Orientation Level: Disoriented to;Place;Time;Situation Current Attention Level: Sustained Memory: Decreased short-term memory Following Commands: Follows one step commands inconsistently;Follows one step commands with increased time Safety/Judgement: Decreased awareness of safety;Decreased awareness of deficits Awareness: Intellectual Problem Solving: Slow processing;Decreased initiation;Difficulty sequencing;Requires verbal cues;Requires tactile cues General Comments: impaired cognition however also impacted by [facility]; would bring swab to mouth then leave in mouth/forgot she had it in her mouth; more alert and interactive after session; asked this therapist how many hours a worked a day; "have a nice day"   General Comments       Exercises Exercises: Other exercises   Shoulder Instructions      Home Living Family/patient expects to be discharged to:: Private residence Living Arrangements: Spouse/significant other Available Help at Discharge: Family;Available 24 hours/day Type of Home: House Home Access: Stairs to enter [employer] of Steps: 1 Entrance Stairs-Rails: None Home Layout: One level     Bathroom Shower/Tub: Chief Strategy Officer: Handicapped height Bathroom Accessibility: Yes How Accessible: Accessible via walker Home Equipment: Walker - 2 wheels;Cane - single point          Prior Functioning/Environment Level of Independence: Independent        Comments: pt ambulates for household distances; driving/cooking/cleaning        OT Problem List: Decreased strength;Decreased range of  motion;Decreased activity tolerance;Impaired balance (sitting and/or standing);Decreased coordination;Decreased cognition;Decreased safety awareness;Decreased knowledge of use of DME or AE;Cardiopulmonary status limiting activity;Obesity;Impaired UE functional use;Pain;Increased edema      OT Treatment/Interventions: Self-care/ADL training;Therapeutic exercise;Neuromuscular education;Energy conservation;DME and/or AE instruction;Therapeutic activities;Cognitive remediation/compensation;Patient/family education;Balance training    OT Goals(Current goals can be found in the care plan section) Acute Rehab OT Goals Patient Stated Goal: to go home OT Goal Formulation: With patient/family Time For Goal Achievement: 09/02/20 Potential to Achieve Goals: Good  OT Frequency: Min 2X/week   Barriers to D/C:            Co-evaluation              AM-PAC OT "6 Clicks" Daily Activity     Outcome Measure Help from another person eating meals?: Total Help from another person taking care of personal grooming?: A Lot Help from another person toileting, which includes using toliet, bedpan, or urinal?: Total Help from another person bathing (including washing, rinsing, drying)?: A Lot Help from another person to put on and taking off regular upper body clothing?: A Lot Help from another person to put on and taking off regular lower body clothing?: Total 6 Click Score: 9   End of Session Equipment Utilized During Treatment: Gait belt;Oxygen (6L) Nurse Communication: Mobility status  Activity Tolerance: Patient tolerated treatment well Patient left: in bed;with call bell/phone within reach;with bed alarm set;with family/visitor present (chair position)  OT Visit Diagnosis: Unsteadiness on feet (R26.81);Other abnormalities of gait and mobility (R26.89);Muscle weakness (generalized) (M62.81);Other symptoms and signs involving cognitive function;Pain Pain - part of body:  (generalized)  Time: 3244-0102 OT Time Calculation (min): 38 min Charges:  OT General Charges $OT Visit: 1 Visit OT Evaluation $OT Eval Moderate Complexity: 1 Mod OT Treatments $Self Care/Home Management : 23-37 mins  Luisa Dago, OT/L   Acute OT Clinical Specialist Acute Rehabilitation Services Pager [phone] Office [phone]   [facility] 08/20/2020, 10:25 AM

## 2020-08-21 DIAGNOSIS — J9621 Acute and chronic respiratory failure with hypoxia: Secondary | ICD-10-CM

## 2020-08-21 DIAGNOSIS — I4891 Unspecified atrial fibrillation: Secondary | ICD-10-CM | POA: Diagnosis not present

## 2020-08-21 DIAGNOSIS — J189 Pneumonia, unspecified organism: Secondary | ICD-10-CM | POA: Diagnosis not present

## 2020-08-21 LAB — BASIC METABOLIC PANEL
Anion gap: 6 (ref 5–15)
BUN: 23 mg/dL (ref 8–23)
CO2: 29 mmol/L (ref 22–32)
Calcium: 8.4 mg/dL — ABNORMAL LOW (ref 8.9–10.3)
Chloride: 103 mmol/L (ref 98–111)
Creatinine, Ser: 0.72 mg/dL (ref 0.44–1.00)
GFR, Estimated: >60 mL/min (ref >60)
Glucose, Bld: 127 mg/dL — ABNORMAL HIGH (ref 70–99)
Potassium: 3.7 mmol/L (ref 3.5–5.1)
Sodium: 138 mmol/L (ref 135–145)

## 2020-08-21 LAB — CBC
HCT: 28.7 % — ABNORMAL LOW (ref 36.0–46.0)
Hemoglobin: 9.2 g/dL — ABNORMAL LOW (ref 12.0–15.0)
MCH: 31.3 pg (ref 26.0–34.0)
MCHC: 32.1 g/dL (ref 30.0–36.0)
MCV: 97.6 fL (ref 80.0–100.0)
Platelets: 239 10*3/uL (ref 150–400)
RBC: 2.94 MIL/uL — ABNORMAL LOW (ref 3.87–5.11)
RDW: 16 % — ABNORMAL HIGH (ref 11.5–15.5)
WBC: 9.5 10*3/uL (ref 4.0–10.5)
nRBC: 0.2 % (ref 0.0–0.2)

## 2020-08-21 LAB — GLUCOSE, CAPILLARY
Glucose-Capillary: 103 mg/dL — ABNORMAL HIGH (ref 70–99)
Glucose-Capillary: 90 mg/dL (ref 70–99)
Glucose-Capillary: 87 mg/dL (ref 70–99)
Glucose-Capillary: 76 mg/dL (ref 70–99)
Glucose-Capillary: 70 mg/dL (ref 70–99)

## 2020-08-21 MED ORDER — METOPROLOL TARTRATE 25 MG PO TABS
25.0000 mg | ORAL_TABLET | Freq: Two times a day (BID) | ORAL | Status: DC
  Administered 2020-08-21 – 2020-08-22 (×2): 25 mg via ORAL
  Filled 2020-08-21 (×2): qty 1

## 2020-08-21 MED ORDER — POTASSIUM CHLORIDE 20 MEQ PO PACK
40.0000 meq | PACK | Freq: Once | ORAL | Status: AC
  Administered 2020-08-21: 40 meq via ORAL

## 2020-08-21 MED ORDER — AMIODARONE HCL 200 MG PO TABS
200.0000 mg | ORAL_TABLET | Freq: Two times a day (BID) | ORAL | Status: DC
  Administered 2020-08-22: 200 mg via ORAL
  Filled 2020-08-21 (×3): qty 1

## 2020-08-21 MED ORDER — METOPROLOL TARTRATE 12.5 MG HALF TABLET
12.5000 mg | ORAL_TABLET | Freq: Once | ORAL | Status: AC
  Administered 2020-08-21: 12.5 mg via ORAL
  Filled 2020-08-21: qty 1

## 2020-08-21 MED ORDER — FUROSEMIDE 10 MG/ML IJ SOLN
40.0000 mg | Freq: Once | INTRAMUSCULAR | Status: AC
  Administered 2020-08-21: 40 mg via INTRAVENOUS
  Filled 2020-08-21: qty 4

## 2020-08-21 MED ORDER — AMIODARONE HCL 200 MG PO TABS
400.0000 mg | ORAL_TABLET | Freq: Every day | ORAL | Status: DC
  Administered 2020-08-21: 400 mg via ORAL
  Filled 2020-08-21: qty 2

## 2020-08-21 NOTE — Progress Notes (Signed)
  Speech Language Pathology Treatment: Dysphagia  Patient Details Name: Katherine Ewing MRN: [id number] DOB: [birth year] Today's Date: 08/21/2020 Time: 5284-1324 SLP Time Calculation (min) (ACUTE ONLY): 14.62 min  Assessment / Plan / Recommendation Clinical Impression  Pt was seen for dysphagia treatment with her husband present. Pt's RN, Melina Schools reported that the pt has been tolerating the current diet without overt s/sx of aspiration, but that p.o. intake was limited. Pt's husband provided conflicting information throughout the session stating "she has a real good appetite" quickly followed by "she doesn't eat much at home", then "she eats really good! She always has!" and then "She doesn't really eat at home; she eats nothing". The reliability of the pt's husband as a historian is questioned. Pt was seated upright in chair and vocal quality was improved compared to yesterday. Pt tolerated nectar thick and regular texture solids without overt s/sx of aspiration. Mastication was mildly prolonged and mild-moderate oral residue was cleared with a liquid wash. Pt continues to demonstrate inconsistent coughing with thin liquids. Pt requested water and her husband consistently reported that the pt loves ice and cold water. Pt's diet will be advanced to dysphagia 3 solids and nectar thick liquids will be continued with allowance of ice chips between meals following oral care. Pt and her husband were educated regarding this and understanding was verbalized, but the extent of their comprehension is questioned. SLP will continue to follow pt.   HPI HPI: 87 y.o. female presented to [facility] ED on 2020-08-26 with 3 days of fatigue, chills, cough. Pt found to be tachy and hypotensive in ED. CXR in ED showed RUL PNA.  No hx of dysphagia per pt/husband at that time. Underwent clinical swallow evaluation on 3/17, no concerns for dysphagia. Oral-motor examination was unremarkable with intact dentition. Self administered multiple sips  water via cup/straw and regular texture without indications of aspiration. Respirations were normal without audible congestion. Therapist upgraded texture from clears to regular, thin liquids. No further ST f/u was recommended. Overnight and morning of 3/18 she became obtunded with severe respiratory distress requiring intubation was transferred to the ICU.  Extubated 3/22. PMH includes HTN, MV prolapse, PAF on eliquis, chronic syst CHF, hypothyroidism, thrombocytopenia.      SLP Plan  Continue with current plan of care       Recommendations  Diet recommendations: Dysphagia 3 (mechanical soft);Nectar-thick liquid Liquids provided via: Cup;Straw Medication Administration: Whole meds with liquid Supervision: Staff to assist with self feeding Compensations: Small sips/bites;Slow rate;Minimize environmental distractions Postural Changes and/or Swallow Maneuvers: Seated upright 90 degrees                Oral Care Recommendations: Oral care QID Follow up Recommendations: Other (comment) (tba) SLP Visit Diagnosis: Dysphagia, oropharyngeal phase (R13.12) Plan: Continue with current plan of care       Shanika I. Vear Clock, MS, CCC-SLP Acute Rehabilitation Services Office number [phone] Pager [phone]                 Scheryl Marten 08/21/2020, 12:02 PM

## 2020-08-21 NOTE — Progress Notes (Signed)
Physical Therapy Treatment Patient Details Name: Katherine Ewing MRN: [id number] DOB: September 12, 1930 Today's Date: 08/21/2020    History of Present Illness 87 y.o. female presenting to [facility] ED on 08/05/2020 with 3 days of fatigue, chills, cough. Pt found to be tachy and hypotensive in ED. CXR in ED showed RUL PNA. 3/18 pt with pulmonary edema with intubation, extubated 3/22. PMH includes HTN, MV prolapse, PAF on eliquis, chronic syst CHF (last EF 45-50% 05/2019), hypothyroidism, thrombocytopenia.    PT Comments    Pt admitted with above diagnosis. Pt was able to sit EOB with min assist and cues and perform a few exercises. SQuat pivot with +2 max assist with heavy use of pad.  Husband present and feel that pt is going to need more extensive assist than husband can do on d/c.  If they can arrange 24 hour care at home, CIR may be appropriate however SNF would give pt longer therapy to be more independent when she goes home. Updated d/c plan to SNF as feel that this is a better option for pt at this time.  Also updated frequency.  Will continue to follow acutely.   Pt currently with functional limitations due to balance and endurance deficits. Pt will benefit from skilled PT to increase their independence and safety with mobility to allow discharge to the venue listed below.     Follow Up Recommendations  SNF;Supervision/Assistance - 24 hour     Equipment Recommendations  Other (comment) (TBD with progression)    Recommendations for Other Services OT consult     Precautions / Restrictions Precautions Precautions: Fall Precaution Comments: foley; rectal foley Restrictions Weight Bearing Restrictions: No    Mobility  Bed Mobility Overal bed mobility: Needs Assistance Bed Mobility: Supine to Sit;Sit to Supine Rolling: Mod assist   Supine to sit: Mod assist;+2 for physical assistance Sit to supine: Mod assist;+2 for physical assistance   General bed mobility comments: pt assistign with pushing up  into sitting adn moving BLE off bed with assistance    Transfers Overall transfer level: Needs assistance Equipment used: 2 person hand held assist Transfers: Sit to/from [employer] Transfers Sit to Stand: Max assist;+2 physical assistance (unable to achieve full upright stature)   Squat pivot transfers: Max assist;+2 physical assistance     General transfer comment: used pad and assist pt squat pivot to recliner with pt intiiating movement but heavy use of pad to move pt bed to recliner as pt not able to lift trunk as well as kept knees flexed and could not power up  Ambulation/Gait                 Stairs             Wheelchair Mobility    Modified Rankin (Stroke Patients Only)       Balance Overall balance assessment: Needs assistance Sitting-balance support: No upper extremity supported;Feet supported;Bilateral upper extremity supported Sitting balance-Leahy Scale: Poor Sitting balance - Comments: very flexed posture and needed assist at times to sit EOB   Standing balance support: Bilateral upper extremity supported;During functional activity Standing balance-Leahy Scale: Zero Standing balance comment: reliant on UE support as well as +2 max assist                            Cognition Arousal/Alertness: Awake/alert Behavior During Therapy: Restless;Flat affect Overall Cognitive Status: Impaired/Different from baseline Area of Impairment: Orientation;Attention;Memory;Following commands;Safety/judgement;Awareness;Problem solving  Orientation Level: Disoriented to;Place;Time;Situation Current Attention Level: Sustained Memory: Decreased short-term memory Following Commands: Follows one step commands inconsistently;Follows one step commands with increased time Safety/Judgement: Decreased awareness of safety;Decreased awareness of deficits Awareness: Intellectual Problem Solving: Slow processing;Decreased  initiation;Difficulty sequencing;Requires verbal cues;Requires tactile cues General Comments: impaired cognition however also impacted by [facility]; speaks softly and doesnt follow commands consistently      Exercises General Exercises - Lower Extremity Long Arc Quad: AROM;Both;10 reps;Seated Hip Flexion/Marching: AAROM;Both;5 reps;Seated    General Comments General comments (skin integrity, edema, etc.): VSS on 10LO2      Pertinent Vitals/Pain Pain Assessment: Faces Faces Pain Scale: Hurts little more Pain Location: generalized discomfort Pain Descriptors / Indicators: Discomfort;Grimacing;Guarding Pain Intervention(s): Limited activity within patient's tolerance;Monitored during session;Repositioned    Home Living                      Prior Function            PT Goals (current goals can now be found in the care plan section) Acute Rehab PT Goals Patient Stated Goal: to go home Progress towards PT goals: Progressing toward goals    Frequency    Min 2X/week      PT Plan Discharge plan needs to be updated;Frequency needs to be updated    Co-evaluation              AM-PAC PT "6 Clicks" Mobility   Outcome Measure  Help needed turning from your back to your side while in a flat bed without using bedrails?: A Lot Help needed moving from lying on your back to sitting on the side of a flat bed without using bedrails?: A Lot Help needed moving to and from a bed to a chair (including a wheelchair)?: A Lot Help needed standing up from a chair using your arms (e.g., wheelchair or bedside chair)?: A Lot Help needed to walk in hospital room?: Total Help needed climbing 3-5 steps with a railing? : Total 6 Click Score: 10    End of Session Equipment Utilized During Treatment: Oxygen;Gait belt Activity Tolerance: Patient limited by fatigue Patient left: with call bell/phone within reach;with family/visitor present;in chair;with bed alarm set (\) Nurse  Communication: Mobility status PT Visit Diagnosis: Unsteadiness on feet (R26.81);Other abnormalities of gait and mobility (R26.89);Muscle weakness (generalized) (M62.81)     Time: 6712-4580 PT Time Calculation (min) (ACUTE ONLY): 31 min  Charges:  $Therapeutic Exercise: 8-22 mins $Therapeutic Activity: 8-22 mins                     Sebasthian Stailey M,PT Acute Rehab Services [phone] (pager)   Bevelyn Buckles 08/21/2020, 1:48 PM

## 2020-08-21 NOTE — Progress Notes (Addendum)
NAME:  Katherine Ewing, MRN:  [id number], DOB:  [birth year], LOS: 8 ADMISSION DATE:  08/04/2020, CONSULTATION DATE:  08/15/20 REFERRING MD:  Nelson Chimes CHIEF COMPLAINT:  SOB   History of Present Illness:  87 yo female presented with malaise, myalgias, cough, and decreased appetite.  Found to have Rt upper lobe CAP and A fib with RVR and hypotension.  Developed respiratory distress with hypoxia and altered mental status on 3/18.  She required intubation, pressors, and transfer to ICU.  Pertinent  Medical History  Paroxysmal atrial fibrillation, Chronic systolic CHF, HTN, HLD. Hypothyroidism  Significant Hospital Events: Including procedures, antibiotic start and stop dates in addition to other pertinent events   3/16 admit 3/17 cardiology consulted 3/18 transfer to ICU, intubated, start pressors 3/19 start amiodarone 3/20 off pressors temporarily 3/22 off pressors, extubated 3/23 improving mental status  Interim History / Subjective:   Overnight pt had agitation and tachypnea requiring BiPap and Precedex.  Pt is more alert and oriented this morning. Converses appropriately though she is very hard of hearing. Precedex was discontinued and pt continued to remain comfortable and oriented. BP and HR slightly elevated, room to start increasing metoprolol.  UOP 1.7 L last 24 hours  Objective   Blood pressure 114/90, pulse (!) 106, temperature 97.6 F (36.4 C), temperature source Oral, resp. rate (!) 23, height 5\' 2"  (1.575 m), weight 70.9 kg, SpO2 95 %.    FiO2 (%):  [40 %] 40 %   Intake/Output Summary (Last 24 hours) at 08/21/2020 0830 Last data filed at 08/21/2020 0600 Gross per 24 hour  Intake 751.53 ml  Output 1975 ml  Net -1223.47 ml   Filed Weights   08/18/20 0329 08/19/20 0500 08/21/20 0500  Weight: 75.3 kg 74.8 kg 70.9 kg    Examination:  General: acutely ill appearing female, alert, NAD HENT: anicteric sclera, MMM, soft, audible voice Cardiac: tachycardic, irregularly  irregular rhythm, normal S1S2 Chest: clear to auscultation bilaterally, normal respiratory effort without accessory muscle use Abdomen: soft, non-tender, non-distended, NABS Extremities: trace edema left lower, 1+ right lower extremity, 1+ pulses bilateral dorsalis pedis Skin: warm, well perfused, no rashes, bruising around right wrist Neuro: RASS 0, A&Ox4, awake upon entering room, able to follow commands and converse on 0.4 mcg Precedex   Labs/imaging personally reviewed    CMP Latest Ref Rng & Units 08/21/2020 08/20/2020 08/19/2020  Glucose 70 - 99 mg/dL 08/21/2020) 025(E) 527(P)  BUN 8 - 23 mg/dL 23 23 824(M)  Creatinine 0.44 - 1.00 mg/dL 35(T 6.14 4.31  Sodium 135 - 145 mmol/L 138 139 137  Potassium 3.5 - 5.1 mmol/L 3.7 4.1 3.7  Chloride 98 - 111 mmol/L 103 106 105  CO2 22 - 32 mmol/L 29 27 25   Calcium 8.9 - 10.3 mg/dL 5.40) ) 7.8(L)  Total Protein 6.5 - 8.1 g/dL - - -  Total Bilirubin 0.3 - 1.2 mg/dL - - -  Alkaline Phos 38 - 126 U/L - - -  AST 15 - 41 U/L - - -  ALT 0 - 44 U/L - - -    CBC Latest Ref Rng & Units 08/21/2020 08/20/2020 08/19/2020  WBC 4.0 - 10.5 K/uL 9.5 7.9 6.0  Hemoglobin 12.0 - 15.0 g/dL 08/22/2020) 08/21/2020) 8.9(L)  Hematocrit 36.0 - 46.0 % 28.7(L) 27.5(L) 26.9(L)  Platelets 150 - 400 K/uL 239 205 187    ABG    Component Value Date/Time   PHART 7.437 08/15/2020 1033   PCO2ART 34.0 08/15/2020 1033   PO2ART 245 (H)  08/15/2020 1033   HCO3 23.0 08/15/2020 1033   TCO2 24 08/15/2020 1033   ACIDBASEDEF 1.0 08/15/2020 1033   O2SAT 100.0 08/15/2020 1033    CBG (last 3)  Recent Labs    08/20/20 1953 08/21/20 0107 08/21/20 0343  GLUCAP 90 103* 90    Resolved Hospital Problem list   Hypotension from sepsis and hypovolemia  Assessment & Plan:   Acute hypoxic respiratory failure from Rt upper lobe community acquired pneumonia and acute pulmonary edema - stable pulmonary edema and bilateral effusion on CXR yesterday - Bipap PRN - supplemental O2 for goal  SpO2>92% - lasix 40 mg, follow BP and heart rate closely - f/u CXR tomorrow  A.fib with RVR Acute on Chronic CHF - cardiology following, appreciate recs - continue eliquis, lipitor - amiodarone 200 mg BID PO - metoprolol 25 mg BID PO - digoxin  PO - telemetry  Chest Pain, Resolved - no longer complaining of pain this morning - f/u in AM  Mild Delirium - improving confusion and agitation - husband at bedside, frequent orientation, lights on during the day, minimize cords and tubing on pt - plan to remove central line as soon as able to - hold Precedex today - melatonin 3mg  QHS - consider Seroquel QHS  Dysphagia - ST assessment - Dysphagia 3, nectar thick liquid - meds PO  Deconditioning - PT/OT - Pt would do well with inpatient rehab prior to d/c home  Hypokalemia - K 3.7, repleted - f/u BMP  Hx Hypothyroidism - Continue home synthroid  Anemia of critical illness - stable at 8.7 this AM - transfuse for Hb < 7 or significant bleeding  Essential tremor - RASS goal 0 to -1 - primidone dose decreased on 3/18 - wean precedex as tolerated  Urine retention - remove foley tomorrow if improved mental status  Diarrhea -stool softener PRN  Best practice (evaluated daily)  Diet: Thickened Liquids Pain/Anxiety/Delirium protocol (if indicated): No VAP protocol (if indicated): Not indicated DVT prophylaxis: Systemic AC GI prophylaxis: N/A Glucose control:  SSI No Central venous access:  Yes, to be removed today Arterial line:  N/A Foley:  Yes, and it is still needed Mobility:  bed rest  PT consulted: Yes Last date of multidisciplinary goals of care discussion None Code Status:  full code Disposition: ICU   4/18, Medical Student 08/21/2020 , 8:30 AM

## 2020-08-21 NOTE — Progress Notes (Signed)
RT note. Pt. Placed on BIPAP due to increase in oxygen, Macarthur Critchley. Pt. Currently on 12/6, R16 40%. RN aware, RT will continue to monitor.

## 2020-08-21 NOTE — Progress Notes (Signed)
Progress Note  Patient Name: Katherine Ewing Date of Encounter: 08/21/2020  Primary Cardiologist: [facility]  Subjective   Awake and answering questions this AM. Remains confused at times. HR's better controlled.   Inpatient Medications    Scheduled Meds: . apixaban  5 mg Oral BID  . atorvastatin  20 mg Oral Daily  . Chlorhexidine Gluconate Cloth  6 each Topical Daily  . digoxin  0.125 mg Oral Daily  . dorzolamide-timolol  1 drop Both Eyes BID  . latanoprost  1 drop Both Eyes QHS  . levothyroxine  88 mcg Oral q morning  . mouth rinse  15 mL Mouth Rinse BID  . melatonin  3 mg Oral QHS  . metoprolol tartrate  12.5 mg Oral BID  . primidone  250 mg Oral BID  . sodium chloride flush  10-40 mL Intracatheter Q12H   Continuous Infusions: . sodium chloride 10 mL/hr at 08/21/20 0600  . sodium chloride    . amiodarone 30 mg/hr (08/21/20 0600)  . dexmedetomidine (PRECEDEX) IV infusion 0.4 mcg/kg/hr (08/21/20 0245)   PRN Meds: sodium chloride, acetaminophen, hydrOXYzine, metoprolol tartrate, polyethylene glycol, Resource ThickenUp Clear, sennosides, sodium chloride flush, traMADol   Vital Signs    Vitals:   08/21/20 0530 08/21/20 0600 08/21/20 0630 08/21/20 0800  BP: 118/79 114/90    Pulse: (!) 111 (!) 114 (!) 106   Resp: (!) 33 (!) 28 (!) 23   Temp:    97.6 F (36.4 C)  TempSrc:    Oral  SpO2: 97% 96% 95%   Weight:      Height:        Intake/Output Summary (Last 24 hours) at 08/21/2020 0919 Last data filed at 08/21/2020 0600 Gross per 24 hour  Intake 717.37 ml  Output 1975 ml  Net -1257.63 ml   Filed Weights   08/18/20 0329 08/19/20 0500 08/21/20 0500  Weight: 75.3 kg 74.8 kg 70.9 kg    Physical Exam   General: Elderly, NAD Neck: Negative for carotid bruits. No JVD Lungs:Diminished in bilateral lower lobes. Breathing is unlabored. Cardiovascular: Irregularly irregular No murmurs Abdomen: Soft, non-tender, non-distended. No obvious abdominal  masses. Extremities: R>L BLE edema. Radial pulses 2+ bilaterally Neuro: Alert. No focal deficits. No facial asymmetry. MAE spontaneously. Psych: Responds to questions somewhat appropriately with normal affect.    Labs    Chemistry Recent Labs  Lab 08/19/20 0334 08/20/20 0518 08/21/20 0514  NA 137 139 138  K 3.7 4.1 3.7  CL 105 106 103  CO2 25 27 29   GLUCOSE 152* 130* 127*  BUN 25* 23 23  CREATININE 0.68 0.66 0.72  CALCIUM 7.8* 8.0* 8.4*  GFRNONAA >60 >60 >60  ANIONGAP 7 6 6      Hematology Recent Labs  Lab 08/19/20 0334 08/20/20 0518 08/21/20 0514  WBC 6.0 7.9 9.5  RBC 2.76* 2.79* 2.94*  HGB 8.9* 8.7* 9.2*  HCT 26.9* 27.5* 28.7*  MCV 97.5 98.6 97.6  MCH 32.2 31.2 31.3  MCHC 33.1 31.6 32.1  RDW 15.4 15.6* 16.0*  PLT 187 205 239    Cardiac EnzymesNo results for input(s): TROPONINI in the last 168 hours. No results for input(s): TROPIPOC in the last 168 hours.   BNPNo results for input(s): BNP, PROBNP in the last 168 hours.   DDimer No results for input(s): DDIMER in the last 168 hours.   Radiology    DG Chest 1 View  Result Date: 08/20/2020 CLINICAL DATA:  Dyspnea, cough EXAM: CHEST  1 VIEW COMPARISON:  08/19/2020 FINDINGS: Interval extubation. Pulmonary insufflation has decreased and lung volumes are small, though symmetric. Focal pulmonary infiltrate has developed within the right perihilar region, possibly infectious or related to aspiration in the supine patient. Small bilateral pleural effusions are again noted with bibasilar compressive atelectasis. Left internal jugular central venous catheter tip is unchanged within the superior vena cava. Mild cardiomegaly is stable. Pulmonary vascularity is normal. No acute bone abnormality. IMPRESSION: Interval extubation with decreased pulmonary insufflation. Small lung volumes. Interval development of a focal pulmonary infiltrate within the right perihilar region, possibly related to infection or aspiration. Small  bilateral pleural effusions with bibasilar compressive atelectasis. Electronically Signed   By: Helyn NumbersAshesh  Parikh MD   On: 08/20/2020 12:26   DG CHEST PORT 1 VIEW  Result Date: 08/20/2020 CLINICAL DATA:  Hypoxia, atrial fibrillation EXAM: PORTABLE CHEST 1 VIEW COMPARISON:  10:40 a.m. FINDINGS: Lung volumes are small, but are symmetric and are stable since prior examination. Small bilateral pleural effusions are again identified, right greater than left, with associated bibasilar compressive atelectasis. Superimposed pulmonary infiltrate within the right perihilar and lower lung zone appears stable since prior examination, possibly related to underlying infection or aspiration. No pneumothorax. Left internal jugular central venous catheter tip again noted overlying the expected superior vena cava. Mild cardiomegaly is stable. Pulmonary vascularity is normal. IMPRESSION: Stable bilateral pleural effusions, right greater than left, with associated bibasilar compressive atelectasis. Stable asymmetric right perihilar and lower lung zone pulmonary infiltrate, possibly related to infection or aspiration. Electronically Signed   By: Helyn NumbersAshesh  Parikh MD   On: 08/20/2020 12:28   DG Swallowing Func-Speech Pathology  Result Date: 08/20/2020 Objective Swallowing Evaluation: Type of Study: MBS-Modified Barium Swallow Study  Patient Details Name: Katherine Ewing MRN: [id number] Date of Birth: 02-14-1931 Today's Date: 08/20/2020 Time: SLP Start Time (ACUTE ONLY): 1030 -SLP Stop Time (ACUTE ONLY): 1044 SLP Time Calculation (min) (ACUTE ONLY): 14 min Past Medical History: Past Medical History: Diagnosis Date . Hypertension  . MVP (mitral valve prolapse)  Past Surgical History: Past Surgical History: Procedure Laterality Date . ABDOMINAL HYSTERECTOMY   . APPENDECTOMY   . CHOLECYSTECTOMY   . COLONOSCOPY WITH PROPOFOL N/A 02/01/2020  Procedure: COLONOSCOPY WITH PROPOFOL;  Surgeon: Jeani HawkingHung, Patrick, MD;  Location: [facility];  Service:  Endoscopy;  Laterality: N/A; . POLYPECTOMY  02/01/2020  Procedure: POLYPECTOMY;  Surgeon: Jeani HawkingHung, Patrick, MD;  Location: [facility];  Service: Endoscopy;; HPI: 87 y.o. [facility] with 3 days of fatigue, chills, cough. Pt found to be tachy and hypotensive in ED. CXR in ED showed RUL PNA.  No hx of dysphagia per pt/husband at that time. Underwent clinical swallow evaluation on 3/17, no concerns for dysphagia. Oral-motor examination was unremarkable with intact dentition. Self administered multiple sips water via cup/straw and regular texture without indications of aspiration. Respirations were normal without audible congestion. Therapist upgraded texture from clears to regular, thin liquids. No further ST f/u was recommended. Overnight and morning of 3/18 she became obtunded with severe respiratory distress requiring intubation was transferred to the ICU.  Extubated 3/22. PMH includes HTN, MV prolapse, PAF on eliquis, chronic syst CHF, hypothyroidism, thrombocytopenia.  Subjective: "what, I can't hear you" Assessment / Plan / Recommendation CHL IP CLINICAL IMPRESSIONS 08/20/2020 Clinical Impression Pt presents with oropharyngeal dysphagia characterized by impaired mastication, reduced posterior bolus propulsion, a pharyngeal delay, and reduced anterior laryngeal movement. She demonstrated lingual pumping, prolonged mastication of regular textures, penetration (PAS 5) and aspiration (PAS  7,8) of thin liquids. Laryngeal invasion was secondary to pharyngeal delay and was less frequently noted with thin liquids via cup. Pt required encouragement to participate in the  study and exhibited difficulty demonstrating compensatory strategies. She frequently stated, "That's not the medicine I take" despite education regarding the purpose of the study. A dysphagia 2 diet with nectar thick liquids is recommended at this time. SLP will follow for dysphagia treatment. SLP Visit Diagnosis Dysphagia,  oropharyngeal phase (R13.12) Attention and concentration deficit following -- Frontal lobe and executive function deficit following -- Impact on safety and function Moderate aspiration risk   CHL IP TREATMENT RECOMMENDATION 08/20/2020 Treatment Recommendations Therapy as outlined in treatment plan below   Prognosis 08/20/2020 Prognosis for Safe Diet Advancement Fair Barriers to Reach Goals Cognitive deficits Barriers/Prognosis Comment -- CHL IP DIET RECOMMENDATION 08/20/2020 SLP Diet Recommendations Dysphagia 2 (Fine chop) solids;Nectar thick liquid Liquid Administration via Cup;Straw Medication Administration Whole meds with liquid Compensations Small sips/bites;Slow rate;Minimize environmental distractions Postural Changes Seated upright at 90 degrees   CHL IP OTHER RECOMMENDATIONS 08/20/2020 Recommended Consults -- Oral Care Recommendations Oral care BID Other Recommendations Order thickener from pharmacy   CHL IP FOLLOW UP RECOMMENDATIONS 08/20/2020 Follow up Recommendations Other (comment)   CHL IP FREQUENCY AND DURATION 08/20/2020 Speech Therapy Frequency (ACUTE ONLY) min 2x/week Treatment Duration 2 weeks      CHL IP ORAL PHASE 08/20/2020 Oral Phase Impaired Oral - Pudding Teaspoon -- Oral - Pudding Cup -- Oral - Honey Teaspoon -- Oral - Honey Cup -- Oral - Nectar Teaspoon -- Oral - Nectar Cup -- Oral - Nectar Straw Lingual pumping;Reduced posterior propulsion Oral - Thin Teaspoon -- Oral - Thin Cup Lingual pumping;Reduced posterior propulsion Oral - Thin Straw Lingual pumping;Reduced posterior propulsion Oral - Puree Lingual pumping;Reduced posterior propulsion Oral - Mech Soft -- Oral - Regular Lingual pumping;Reduced posterior propulsion;Impaired mastication Oral - Multi-Consistency -- Oral - Pill -- Oral Phase - Comment --  CHL IP PHARYNGEAL PHASE 08/20/2020 Pharyngeal Phase Impaired Pharyngeal- Pudding Teaspoon -- Pharyngeal -- Pharyngeal- Pudding Cup -- Pharyngeal -- Pharyngeal- Honey Teaspoon -- Pharyngeal --  Pharyngeal- Honey Cup -- Pharyngeal -- Pharyngeal- Nectar Teaspoon -- Pharyngeal -- Pharyngeal- Nectar Cup -- Pharyngeal -- Pharyngeal- Nectar Straw Reduced anterior laryngeal mobility;Delayed swallow initiation-vallecula;Delayed swallow initiation-pyriform sinuses Pharyngeal -- Pharyngeal- Thin Teaspoon -- Pharyngeal -- Pharyngeal- Thin Cup Reduced anterior laryngeal mobility;Delayed swallow initiation-vallecula;Delayed swallow initiation-pyriform sinuses;Penetration/Aspiration during swallow Pharyngeal Material enters airway, remains ABOVE vocal cords and not ejected out Pharyngeal- Thin Straw Reduced anterior laryngeal mobility;Delayed swallow initiation-vallecula;Delayed swallow initiation-pyriform sinuses;Penetration/Aspiration during swallow;Penetration/Apiration after swallow Pharyngeal Material enters airway, CONTACTS cords and not ejected out;Material enters airway, passes BELOW cords and not ejected out despite cough attempt by patient;Material enters airway, passes BELOW cords without attempt by patient to eject out (silent aspiration) Pharyngeal- Puree Reduced anterior laryngeal mobility;Delayed swallow initiation-vallecula;Delayed swallow initiation-pyriform sinuses Pharyngeal -- Pharyngeal- Mechanical Soft -- Pharyngeal -- Pharyngeal- Regular Reduced anterior laryngeal mobility;Delayed swallow initiation-vallecula Pharyngeal -- Pharyngeal- Multi-consistency -- Pharyngeal -- Pharyngeal- Pill Reduced anterior laryngeal mobility;Delayed swallow initiation-vallecula Pharyngeal -- Pharyngeal Comment --  CHL IP CERVICAL ESOPHAGEAL PHASE 08/20/2020 Cervical Esophageal Phase WFL Pudding Teaspoon -- Pudding Cup -- Honey Teaspoon -- Honey Cup -- Nectar Teaspoon -- Nectar Cup -- Nectar Straw -- Thin Teaspoon -- Thin Cup -- Thin Straw -- Puree -- Mechanical Soft -- Regular -- Multi-consistency -- Pill -- Cervical Esophageal Comment -- Shanika I. Vear Clock, MS, CCC-SLP Acute Rehabilitation Services Office number  [phone] Pager [phone] Scheryl Marten  08/20/2020, 12:08 PM              Telemetry    08/21/20 AF with rates in the low 100's Personally Reviewed  ECG    No new tracing as of 08/21/20- Personally Reviewed  Cardiac Studies   Echo 08/18/20:  1. Endocardium is difficult to see. Overall LVEF is probably moderately depressed with hypokineis of the septum, anterior, inferior and apical wall segments Would recomm limited echo with Definity to further define wall motion and LVEF. The left ventricle demonstrates global hypokinesis. Left ventricular diastolic parameters are indeterminate.  2. Right ventricular systolic function is low normal. The right ventricular size is normal. There is normal pulmonary artery systolic pressure.  3. Right atrial size was severely dilated.  4. Mild mitral valve regurgitation.  5. The aortic valve is tricuspid. Aortic valve regurgitation is trivial.  6. The inferior vena cava is dilated in size with <50% respiratory variability, suggesting right atrial pressure of 15 mmHg.  Patient Profile     87 y.o. female with a hx of atrial fib/flutter, tachy-mediated cardiomyopathy, prior PE/DVT who presented with shortness of breath, hypotension, atrial fibrillation with RVR  Assessment & Plan    1. Hypoxic respiratory failure secondary to RUL PNA/septic shock: -Extubated 08/19/20 however notes overnight from [facility] suggest worsening delirium with declining respiratory status  -Placed on Precedex and Bipap>>off this morning with stable saturations -Management per PCCM    2. Atrial fibrillation with RVR/tachymedicated cardiomyopathy: -Treated with amiodarone due to poor LV function>>started on digoxin this admission as well   -Anticoagulated with Elquis>>Hb stable  -Would up-titrate metoprolol to 25mg  BID and continue amiodarone and digoxin at current doses   3. Hypotension: -Stablized, 114/90>>118/79>127/75 -Will up-titrate metoprolol to 25mg   BID>>likely will give optimal rate control   4. Presumed tachy mediate induced cardiomyopathy: -Endocardium felt to be difficult to see>>Overall LVEF is probably moderately  depressed with hypokineis of the septum, anterior, inferior and apical  wall segments -Due to Poor image quality, plan to repeat echo study this admission for confirmation>>would recommend Definity -Plan GDMT once re-assessment complete     Signed, NP-C [facility] Pager: [phone] 08/21/2020, 9:19 AM     For questions or updates, please contact   Please consult [email or web address] for contact info under Cardiology/STEMI.

## 2020-08-21 NOTE — Progress Notes (Signed)
eLink Physician-Brief Progress Note Patient Name: Katherine Ewing DOB: 1930/09/21 MRN: [id number]   Date of Service  08/21/2020  HPI/Events of Note  Patient with tachypnea and delirium, she looks like she may be fatiguing, she is post-extubation with gradually declining respiratory status. She is afebrile with a WBC of 7.9.  eICU Interventions  Will trial low level Precedex + BiPAP to try to address likely de-recruitment that is driving her increasing tachypnea.         Thomasene Lot Westlyn Glaza 08/21/2020, 2:44 AM

## 2020-08-22 ENCOUNTER — Inpatient Hospital Stay (HOSPITAL_COMMUNITY): Payer: Medicare HMO

## 2020-08-22 DIAGNOSIS — J9621 Acute and chronic respiratory failure with hypoxia: Secondary | ICD-10-CM | POA: Diagnosis not present

## 2020-08-22 LAB — GLUCOSE, CAPILLARY
Glucose-Capillary: 70 mg/dL (ref 70–99)
Glucose-Capillary: 71 mg/dL (ref 70–99)
Glucose-Capillary: 80 mg/dL (ref 70–99)

## 2020-08-22 LAB — BASIC METABOLIC PANEL
Anion gap: 8 (ref 5–15)
BUN: 21 mg/dL (ref 8–23)
CO2: 29 mmol/L (ref 22–32)
Calcium: 8.3 mg/dL — ABNORMAL LOW (ref 8.9–10.3)
Chloride: 100 mmol/L (ref 98–111)
Creatinine, Ser: 0.73 mg/dL (ref 0.44–1.00)
GFR, Estimated: >60 mL/min (ref >60)
Glucose, Bld: 78 mg/dL (ref 70–99)
Potassium: 3.3 mmol/L — ABNORMAL LOW (ref 3.5–5.1)
Sodium: 137 mmol/L (ref 135–145)

## 2020-08-22 LAB — CBC
HCT: 29.4 % — ABNORMAL LOW (ref 36.0–46.0)
Hemoglobin: 9.5 g/dL — ABNORMAL LOW (ref 12.0–15.0)
MCH: 31.8 pg (ref 26.0–34.0)
MCHC: 32.3 g/dL (ref 30.0–36.0)
MCV: 98.3 fL (ref 80.0–100.0)
Platelets: 255 10*3/uL (ref 150–400)
RBC: 2.99 MIL/uL — ABNORMAL LOW (ref 3.87–5.11)
RDW: 16.2 % — ABNORMAL HIGH (ref 11.5–15.5)
WBC: 10.3 10*3/uL (ref 4.0–10.5)
nRBC: 0.2 % (ref 0.0–0.2)

## 2020-08-22 LAB — MAGNESIUM: Magnesium: 2 mg/dL (ref 1.7–2.4)

## 2020-08-22 MED ORDER — METOPROLOL TARTRATE 25 MG PO TABS
37.5000 mg | ORAL_TABLET | Freq: Two times a day (BID) | ORAL | Status: DC
  Administered 2020-08-23: 37.5 mg via ORAL
  Filled 2020-08-22 (×3): qty 1

## 2020-08-22 MED ORDER — DEXTROSE 5 % IV SOLN
100.0000 mg | Freq: Once | INTRAVENOUS | Status: AC
  Administered 2020-08-23: 100 mg via INTRAVENOUS
  Filled 2020-08-22: qty 2

## 2020-08-22 MED ORDER — LIP MEDEX EX OINT
TOPICAL_OINTMENT | CUTANEOUS | Status: DC | PRN
  Filled 2020-08-22: qty 7

## 2020-08-22 MED ORDER — ACETAMINOPHEN 325 MG PO TABS: 650.0000 mg | ORAL_TABLET | Freq: Four times a day (QID) | ORAL | Status: DC | PRN

## 2020-08-22 MED ORDER — POTASSIUM CHLORIDE 10 MEQ/50ML IV SOLN
10.0000 meq | INTRAVENOUS | Status: AC
  Administered 2020-08-22 (×4): 10 meq via INTRAVENOUS
  Filled 2020-08-22 (×4): qty 50

## 2020-08-22 MED ORDER — SPIRONOLACTONE 25 MG PO TABS
25.0000 mg | ORAL_TABLET | Freq: Every day | ORAL | Status: DC
  Administered 2020-08-22: 25 mg via ORAL
  Filled 2020-08-22 (×2): qty 1

## 2020-08-22 MED ORDER — POTASSIUM CHLORIDE CRYS ER 20 MEQ PO TBCR
20.0000 meq | EXTENDED_RELEASE_TABLET | ORAL | Status: AC
  Administered 2020-08-22 (×2): 20 meq via ORAL
  Filled 2020-08-22 (×2): qty 1

## 2020-08-22 MED ORDER — POLYETHYLENE GLYCOL 3350 17 G PO PACK: 17.0000 g | PACK | Freq: Every day | ORAL | Status: DC | PRN

## 2020-08-22 MED ORDER — SENNOSIDES 8.8 MG/5ML PO SYRP
5.0000 mL | ORAL_SOLUTION | Freq: Every evening | ORAL | Status: DC | PRN
  Filled 2020-08-22: qty 5

## 2020-08-22 NOTE — Progress Notes (Signed)
Inpatient Rehabilitation Admissions Coordinator  Therapy has changed recommendations to SNF for patient will need prolonged rehab before her spouse will be able to manage her at home. SNF recommended. I have alerted TOC, Steward Drone and Erie Noe. We will sign off at this time.  Ottie Glazier, RN, MSN Rehab Admissions Coordinator [phone] 08/22/2020 1:22 PM

## 2020-08-22 NOTE — Progress Notes (Signed)
Progress Note  Patient Name: Katherine Ewing Date of Encounter: 08/22/2020  Primary Cardiologist: [facility]  Subjective   Up to chair today. Feels good. Very HOH. Less confused. Husband at bedside   Inpatient Medications    Scheduled Meds: . amiodarone  200 mg Oral BID  . apixaban  5 mg Oral BID  . atorvastatin  20 mg Oral Daily  . Chlorhexidine Gluconate Cloth  6 each Topical Daily  . digoxin  0.125 mg Oral Daily  . dorzolamide-timolol  1 drop Both Eyes BID  . latanoprost  1 drop Both Eyes QHS  . levothyroxine  88 mcg Oral q morning  . mouth rinse  15 mL Mouth Rinse BID  . melatonin  3 mg Oral QHS  . metoprolol tartrate  25 mg Oral BID  . potassium chloride  20 mEq Oral Q4H  . primidone  250 mg Oral BID  . sodium chloride flush  10-40 mL Intracatheter Q12H   Continuous Infusions: . sodium chloride 10 mL/hr at 08/21/20 0600  . sodium chloride    . dexmedetomidine (PRECEDEX) IV infusion 0.4 mcg/kg/hr (08/21/20 0245)  . potassium chloride 10 mEq (08/22/20 0946)   PRN Meds: sodium chloride, acetaminophen, hydrOXYzine, metoprolol tartrate, polyethylene glycol, Resource ThickenUp Clear, sennosides, sodium chloride flush, traMADol   Vital Signs    Vitals:   08/22/20 0600 08/22/20 0700 08/22/20 0715 08/22/20 0800  BP: (!) 142/87 (!) 152/92  (!) 119/109  Pulse: 100 (!) 122 (!) 110 (!) 133  Resp: (!) 27 (!) 25 (!) 25 (!) 35  Temp:   98.2 F (36.8 C)   TempSrc:   Oral   SpO2: 96% 97% 96% 91%  Weight:      Height:        Intake/Output Summary (Last 24 hours) at 08/22/2020 1018 Last data filed at 08/22/2020 0600 Gross per 24 hour  Intake --  Output 1400 ml  Net -1400 ml   Filed Weights   08/18/20 0329 08/19/20 0500 08/21/20 0500  Weight: 75.3 kg 74.8 kg 70.9 kg    Physical Exam   General: Elderly, NAD Neck: Negative for carotid bruits. No JVD Lungs:Clear to ausculation bilaterally. No wheezes, rales, or rhonchi. Breathing is unlabored. Cardiovascular:  Irregularly irregular. No murmurs Abdomen: Soft, non-tender, non-distended. No obvious abdominal masses. Extremities: R>L 2+ LE edema. Radial pulses 2+ bilaterally Neuro: Alert. No focal deficits. No facial asymmetry. MAE spontaneously. Psych: Responds to questions somewhat appropriately with normal affect.    Labs    Chemistry Recent Labs  Lab 08/20/20 0518 08/21/20 0514 08/22/20 0613  NA 139 138 137  K 4.1 3.7 3.3*  CL 106 103 100  CO2 27 29 29   GLUCOSE 130* 127* 78  BUN 23 23 21   CREATININE 0.66 0.72 0.73  CALCIUM 8.0* 8.4* 8.3*  GFRNONAA >60 >60 >60  ANIONGAP 6 6 8      Hematology Recent Labs  Lab 08/20/20 0518 08/21/20 0514 08/22/20 0613  WBC 7.9 9.5 10.3  RBC 2.79* 2.94* 2.99*  HGB 8.7* 9.2* 9.5*  HCT 27.5* 28.7* 29.4*  MCV 98.6 97.6 98.3  MCH 31.2 31.3 31.8  MCHC 31.6 32.1 32.3  RDW 15.6* 16.0* 16.2*  PLT 205 239 255    Cardiac EnzymesNo results for input(s): TROPONINI in the last 168 hours. No results for input(s): TROPIPOC in the last 168 hours.   BNPNo results for input(s): BNP, PROBNP in the last 168 hours.   DDimer No results for input(s): DDIMER in the last  168 hours.   Radiology    DG Chest 1 View  Result Date: 08/20/2020 CLINICAL DATA:  Dyspnea, cough EXAM: CHEST  1 VIEW COMPARISON:  08/19/2020 FINDINGS: Interval extubation. Pulmonary insufflation has decreased and lung volumes are small, though symmetric. Focal pulmonary infiltrate has developed within the right perihilar region, possibly infectious or related to aspiration in the supine patient. Small bilateral pleural effusions are again noted with bibasilar compressive atelectasis. Left internal jugular central venous catheter tip is unchanged within the superior vena cava. Mild cardiomegaly is stable. Pulmonary vascularity is normal. No acute bone abnormality. IMPRESSION: Interval extubation with decreased pulmonary insufflation. Small lung volumes. Interval development of a focal pulmonary  infiltrate within the right perihilar region, possibly related to infection or aspiration. Small bilateral pleural effusions with bibasilar compressive atelectasis. Electronically Signed   By: Helyn Numbers MD   On: 08/20/2020 12:26   DG Chest Port 1 View  Result Date: 08/22/2020 CLINICAL DATA:  Acute on chronic respiratory failure with hypoxia EXAM: PORTABLE CHEST 1 VIEW COMPARISON:  08/20/2020, CT 07/06/2018 FINDINGS: Left IJ approach central venous catheter tip terminates at the left brachiocephalic-superior caval confluence. Right heart border is partially obscured by adjacent opacity. Grossly stable cardiomediastinal contours with cardiomegaly and a calcified tortuous aorta. Stable bilateral pleural effusions with adjacent opacity likely reflecting some passive atelectasis. Additional patchy areas of consolidation elsewhere in both lungs, right greater than left, likely infectious or inflammatory. No pneumothorax. Pulmonary vascularity is relatively normally distributed. No other acute osseous or soft tissue abnormality. IMPRESSION: 1. Left IJ approach central venous catheter tip terminates at the left brachiocephalic-superior caval confluence. 2. Stable bilateral pleural effusions with adjacent opacity likely reflecting some passive atelectasis. 3. Additional patchy opacities likely reflect some superimposed infection or inflammation, similar to prior. Electronically Signed   By: Kreg Shropshire M.D.   On: 08/22/2020 06:44   DG CHEST PORT 1 VIEW  Result Date: 08/20/2020 CLINICAL DATA:  Hypoxia, atrial fibrillation EXAM: PORTABLE CHEST 1 VIEW COMPARISON:  10:40 a.m. FINDINGS: Lung volumes are small, but are symmetric and are stable since prior examination. Small bilateral pleural effusions are again identified, right greater than left, with associated bibasilar compressive atelectasis. Superimposed pulmonary infiltrate within the right perihilar and lower lung zone appears stable since prior examination,  possibly related to underlying infection or aspiration. No pneumothorax. Left internal jugular central venous catheter tip again noted overlying the expected superior vena cava. Mild cardiomegaly is stable. Pulmonary vascularity is normal. IMPRESSION: Stable bilateral pleural effusions, right greater than left, with associated bibasilar compressive atelectasis. Stable asymmetric right perihilar and lower lung zone pulmonary infiltrate, possibly related to infection or aspiration. Electronically Signed   By: Helyn Numbers MD   On: 08/20/2020 12:28   DG Swallowing Func-Speech Pathology  Result Date: 08/20/2020 Objective Swallowing Evaluation: Type of Study: MBS-Modified Barium Swallow Study  Patient Details Name: Kierston Plasencia MRN: [id number] Date of Birth: 07/26/1930 Today's Date: 08/20/2020 Time: SLP Start Time (ACUTE ONLY): 1030 -SLP Stop Time (ACUTE ONLY): 1044 SLP Time Calculation (min) (ACUTE ONLY): 14 min Past Medical History: Past Medical History: Diagnosis Date . Hypertension  . MVP (mitral valve prolapse)  Past Surgical History: Past Surgical History: Procedure Laterality Date . ABDOMINAL HYSTERECTOMY   . APPENDECTOMY   . CHOLECYSTECTOMY   . COLONOSCOPY WITH PROPOFOL N/A 02/01/2020  Procedure: COLONOSCOPY WITH PROPOFOL;  Surgeon: Jeani Hawking, MD;  Location: [facility];  Service: Endoscopy;  Laterality: N/A; . POLYPECTOMY  02/01/2020  Procedure: POLYPECTOMY;  Surgeon: Jeani Hawking,  MD;  Location: [facility];  Service: Endoscopy;; HPI: 87 y.o. female presented to [facility] ED on 09-07-2020 with 3 days of fatigue, chills, cough. Pt found to be tachy and hypotensive in ED. CXR in ED showed RUL PNA.  No hx of dysphagia per pt/husband at that time. Underwent clinical swallow evaluation on 3/17, no concerns for dysphagia. Oral-motor examination was unremarkable with intact dentition. Self administered multiple sips water via cup/straw and regular texture without indications of aspiration. Respirations were normal  without audible congestion. Therapist upgraded texture from clears to regular, thin liquids. No further ST f/u was recommended. Overnight and morning of 3/18 she became obtunded with severe respiratory distress requiring intubation was transferred to the ICU.  Extubated 3/22. PMH includes HTN, MV prolapse, PAF on eliquis, chronic syst CHF, hypothyroidism, thrombocytopenia.  Subjective: "what, I can't hear you" Assessment / Plan / Recommendation CHL IP CLINICAL IMPRESSIONS 08/20/2020 Clinical Impression Pt presents with oropharyngeal dysphagia characterized by impaired mastication, reduced posterior bolus propulsion, a pharyngeal delay, and reduced anterior laryngeal movement. She demonstrated lingual pumping, prolonged mastication of regular textures, penetration (PAS 5) and aspiration (PAS 7,8) of thin liquids. Laryngeal invasion was secondary to pharyngeal delay and was less frequently noted with thin liquids via cup. Pt required encouragement to participate in the  study and exhibited difficulty demonstrating compensatory strategies. She frequently stated, "That's not the medicine I take" despite education regarding the purpose of the study. A dysphagia 2 diet with nectar thick liquids is recommended at this time. SLP will follow for dysphagia treatment. SLP Visit Diagnosis Dysphagia, oropharyngeal phase (R13.12) Attention and concentration deficit following -- Frontal lobe and executive function deficit following -- Impact on safety and function Moderate aspiration risk   CHL IP TREATMENT RECOMMENDATION 08/20/2020 Treatment Recommendations Therapy as outlined in treatment plan below   Prognosis 08/20/2020 Prognosis for Safe Diet Advancement Fair Barriers to Reach Goals Cognitive deficits Barriers/Prognosis Comment -- CHL IP DIET RECOMMENDATION 08/20/2020 SLP Diet Recommendations Dysphagia 2 (Fine chop) solids;Nectar thick liquid Liquid Administration via Cup;Straw Medication Administration Whole meds with liquid  Compensations Small sips/bites;Slow rate;Minimize environmental distractions Postural Changes Seated upright at 90 degrees   CHL IP OTHER RECOMMENDATIONS 08/20/2020 Recommended Consults -- Oral Care Recommendations Oral care BID Other Recommendations Order thickener from pharmacy   CHL IP FOLLOW UP RECOMMENDATIONS 08/20/2020 Follow up Recommendations Other (comment)   CHL IP FREQUENCY AND DURATION 08/20/2020 Speech Therapy Frequency (ACUTE ONLY) min 2x/week Treatment Duration 2 weeks      CHL IP ORAL PHASE 08/20/2020 Oral Phase Impaired Oral - Pudding Teaspoon -- Oral - Pudding Cup -- Oral - Honey Teaspoon -- Oral - Honey Cup -- Oral - Nectar Teaspoon -- Oral - Nectar Cup -- Oral - Nectar Straw Lingual pumping;Reduced posterior propulsion Oral - Thin Teaspoon -- Oral - Thin Cup Lingual pumping;Reduced posterior propulsion Oral - Thin Straw Lingual pumping;Reduced posterior propulsion Oral - Puree Lingual pumping;Reduced posterior propulsion Oral - Mech Soft -- Oral - Regular Lingual pumping;Reduced posterior propulsion;Impaired mastication Oral - Multi-Consistency -- Oral - Pill -- Oral Phase - Comment --  CHL IP PHARYNGEAL PHASE 08/20/2020 Pharyngeal Phase Impaired Pharyngeal- Pudding Teaspoon -- Pharyngeal -- Pharyngeal- Pudding Cup -- Pharyngeal -- Pharyngeal- Honey Teaspoon -- Pharyngeal -- Pharyngeal- Honey Cup -- Pharyngeal -- Pharyngeal- Nectar Teaspoon -- Pharyngeal -- Pharyngeal- Nectar Cup -- Pharyngeal -- Pharyngeal- Nectar Straw Reduced anterior laryngeal mobility;Delayed swallow initiation-vallecula;Delayed swallow initiation-pyriform sinuses Pharyngeal -- Pharyngeal- Thin Teaspoon -- Pharyngeal -- Pharyngeal- Thin Cup Reduced anterior laryngeal mobility;Delayed swallow  initiation-vallecula;Delayed swallow initiation-pyriform sinuses;Penetration/Aspiration during swallow Pharyngeal Material enters airway, remains ABOVE vocal cords and not ejected out Pharyngeal- Thin Straw Reduced anterior laryngeal  mobility;Delayed swallow initiation-vallecula;Delayed swallow initiation-pyriform sinuses;Penetration/Aspiration during swallow;Penetration/Apiration after swallow Pharyngeal Material enters airway, CONTACTS cords and not ejected out;Material enters airway, passes BELOW cords and not ejected out despite cough attempt by patient;Material enters airway, passes BELOW cords without attempt by patient to eject out (silent aspiration) Pharyngeal- Puree Reduced anterior laryngeal mobility;Delayed swallow initiation-vallecula;Delayed swallow initiation-pyriform sinuses Pharyngeal -- Pharyngeal- Mechanical Soft -- Pharyngeal -- Pharyngeal- Regular Reduced anterior laryngeal mobility;Delayed swallow initiation-vallecula Pharyngeal -- Pharyngeal- Multi-consistency -- Pharyngeal -- Pharyngeal- Pill Reduced anterior laryngeal mobility;Delayed swallow initiation-vallecula Pharyngeal -- Pharyngeal Comment --  CHL IP CERVICAL ESOPHAGEAL PHASE 08/20/2020 Cervical Esophageal Phase WFL Pudding Teaspoon -- Pudding Cup -- Honey Teaspoon -- Honey Cup -- Nectar Teaspoon -- Nectar Cup -- Nectar Straw -- Thin Teaspoon -- Thin Cup -- Thin Straw -- Puree -- Mechanical Soft -- Regular -- Multi-consistency -- Pill -- Cervical Esophageal Comment -- Shanika I. Vear Clock, MS, CCC-SLP Acute Rehabilitation Services Office number [phone] Pager [phone] Scheryl Marten 08/20/2020, 12:08 PM              Telemetry    08/22/20 AF with rates in the 80 to low100's - Personally Reviewed  ECG    No new tracing as of 08/22/20- Personally Reviewed  Cardiac Studies   Echo 08/18/20:  1. Endocardium is difficult to see. Overall LVEF is probably moderately depressed with hypokineis of the septum, anterior, inferior and apical wall segments Would recomm limited echo with Definity to further define wall motion and LVEF. The left ventricle demonstrates global hypokinesis. Left ventricular diastolic parameters are indeterminate.  2. Right  ventricular systolic function is low normal. The right ventricular size is normal. There is normal pulmonary artery systolic pressure.  3. Right atrial size was severely dilated.  4. Mild mitral valve regurgitation.  5. The aortic valve is tricuspid. Aortic valve regurgitation is trivial.  6. The inferior vena cava is dilated in size with <50% respiratory variability, suggesting right atrial pressure of 15 mmHg.  Patient Profile     87 y.o. female with a hx of atrial fib/flutter, tachy-mediated cardiomyopathy, prior PE/DVT who presented with shortness of breath, hypotension, atrial fibrillation with RVR  Assessment & Plan   1. Hypoxic respiratory failure secondary to RUL PNA/septic shock: -Extubated 08/19/20 however notes overnight from [facility] suggest worsening delirium with declining respiratory status  -Previously placed on Precedex and Bipap>>off yesterday and tolerating RA very well. -Up to chair today>>central line to come out  -Management per PCCM    2. Atrial fibrillation with RVR/tachymedicated cardiomyopathy: -Treated with IV amiodarone due to poor LV function>>started on digoxin this admission as well   -Anticoagulated with Elquis>>Hb stable hovering around 9.0  -Continue Amiodarone at 200mg  BID -Increase metoprolol to 37.5 BID  -Plan for Amio 200mg  BIDx2 weeks then reduce to 200mg  QD thereafter -Continue digoxin at current dose  3. Hypotension: -Stablized, 119/109>>152/92>>142/87 -Increase metoprolol to 37.5 BID  4. Presumed tachy mediate induced cardiomyopathy: -Endocardium felt to be difficult to see>>Overall LVEF is probably moderately  depressed with hypokineis of the septum, anterior, inferior and apical  wall segments -Due to poor image quality, plan to repeat echo study this admission for confirmation>>would recommend Definity -Plan GDMT once re-assessment complete   5. Hypokalemia: -K+ 3.3 today>>replaced by primary team  -Follow BMET    Signed, NP-C [facility] Pager: [phone] 08/22/2020, 10:18 AM  For questions or updates, please contact   Please consult [email or web address] for contact info under Cardiology/STEMI.

## 2020-08-22 NOTE — Progress Notes (Signed)
eLink Physician-Brief Progress Note Patient Name: Karalyne Whittenberg DOB: Oct 08, 1930 MRN: [id number]   Date of Service  08/22/2020  HPI/Events of Note  Patient is confused, refusing her medications, and tachycardic, I am unable to camera in to assess the patient.  eICU Interventions  Ground crew requested to see the patient.        Migdalia Dk 08/22/2020, 10:49 PM

## 2020-08-22 NOTE — Progress Notes (Signed)
Pharmacy Electrolyte Replacement  Recent Labs:  Recent Labs    08/22/20 0613  K 3.3*  MG 2.0  CREATININE 0.73    Low Critical Values (K </= 2.5, Phos </= 1, Mg </= 1) Present: None  Plan:  Kcl po x 2 kcl iv 10 meq x 4  Elmer Sow, PharmD, BCCCP Clinical Pharmacist [phone]  Please check AMION for all [facility] Pharmacy numbers  08/22/2020 8:02 AM

## 2020-08-22 NOTE — Progress Notes (Signed)
Patient seen  Stating she is scared No complaint of any pain or discomfort  Heart rate of 108-120  Blood pressure 179/86  Does not appear agitated, not taking medications orally  Will give 100 mg amiodarone IV-50% normal dose  Continue to monitor

## 2020-08-22 NOTE — Progress Notes (Signed)
NAME:  Katherine Ewing, MRN:  [id number], DOB:  20-Mar-1931, LOS: 9 ADMISSION DATE:  08/05/2020, CONSULTATION DATE:  08/15/20 REFERRING MD:  Nelson Chimes CHIEF COMPLAINT:  SOB   History of Present Illness:  87 yo female presented with malaise, myalgias, cough, and decreased appetite.  Found to have Rt upper lobe CAP and A fib with RVR and hypotension.  Developed respiratory distress with hypoxia and altered mental status on 3/18.  She required intubation, pressors, and transfer to ICU.  Pertinent  Medical History  Paroxysmal atrial fibrillation, Chronic systolic CHF, HTN, HLD. Hypothyroidism  Significant Hospital Events: Including procedures, antibiotic start and stop dates in addition to other pertinent events   3/16 admit 3/17 cardiology consulted 3/18 transfer to ICU, intubated, start pressors 3/19 start amiodarone 3/20 off pressors temporarily 3/22 off pressors, extubated 3/23 improving mental status 3/25 transfer to progressive care  Interim History / Subjective:  Pt slept overnight with no Precedex or Bipap requirements. Pt is alert and oriented this morning asking to sit in the chair. She is adamant about going home as soon as possible. She appears to have increased difficulty hearing this morning requiring communication with pen and paper. Reduced oxygen demand this morning at 4 L [place] from 10 L overnight. UOP 1.4 L last 24 hours  Objective   Blood pressure (!) 119/109, pulse (!) 133, temperature 98.2 F (36.8 C), temperature source Oral, resp. rate (!) 35, height 5\' 2"  (1.575 m), weight 70.9 kg, SpO2 91 %.        Intake/Output Summary (Last 24 hours) at 08/22/2020 0926 Last data filed at 08/22/2020 0600 Gross per 24 hour  Intake --  Output 1400 ml  Net -1400 ml   Filed Weights   08/18/20 0329 08/19/20 0500 08/21/20 0500  Weight: 75.3 kg 74.8 kg 70.9 kg    Examination:  General: acutely ill appearing elderly female, alert, NAD HENT: anicteric sclera, MMM, soft, audible  voice Cardiac: tachycardic, irregularly irregular rhythm, normal S1S2 Chest: clear to auscultation bilaterally, normal respiratory effort without accessory muscle use Abdomen: soft, non-tender, non-distended, NABS Extremities: trace edema bilateral lower extremities Skin: warm, well perfused, no rashes, bruising around right wrist Neuro: Not on sedation, alert and interacts appropriately, follows commands, able to stand to move to a chair   Labs/imaging personally reviewed    CMP Latest Ref Rng & Units 08/22/2020 08/21/2020 08/20/2020  Glucose 70 - 99 mg/dL 78 08/22/2020) 948(N)  BUN 8 - 23 mg/dL 21 23 23   Creatinine 0.44 - 1.00 mg/dL 462(V 0.35  Sodium 135 - 145 mmol/L 137 138 139  Potassium 3.5 - 5.1 mmol/L 3.3(L) 3.7 4.1  Chloride 98 - 111 mmol/L 100 103 106  CO2 22 - 32 mmol/L 29 29 27   Calcium 8.9 - 10.3 mg/dL 8.3(L) 8.4(L) 8.0(L)  Total Protein 6.5 - 8.1 g/dL - - -  Total Bilirubin 0.3 - 1.2 mg/dL - - -  Alkaline Phos 38 - 126 U/L - - -  AST 15 - 41 U/L - - -  ALT 0 - 44 U/L - - -    CBC Latest Ref Rng & Units 08/22/2020 08/21/2020 08/20/2020  WBC 4.0 - 10.5 K/uL 10.3 9.5 7.9  Hemoglobin 12.0 - 15.0 g/dL 08/24/2020) 08/08/2020) 08/22/2020)  Hematocrit 36.0 - 46.0 % 29.4(L) 28.7(L) 27.5(L)  Platelets 150 - 400 K/uL 255 239 205    ABG    Component Value Date/Time   PHART 7.437 08/15/2020 1033   PCO2ART 34.0 08/15/2020 1033   PO2ART  245 (H) 08/15/2020 1033   HCO3 23.0 08/15/2020 1033   TCO2 24 08/15/2020 1033   ACIDBASEDEF 1.0 08/15/2020 1033   O2SAT 100.0 08/15/2020 1033    CBG (last 3)  Recent Labs    08/22/20 0003 08/22/20 0344 08/22/20 0803  GLUCAP 71 80 70    Resolved Hospital Problem list   Hypotension from sepsis and hypovolemia Chest Pain Diarrhea  Assessment & Plan:  Katherine Ewing is a 87 yo female who was admitted for RUL PNA complicated by acute pulmonary edema and hypoxic respiratory failure. Pt has improving respiratory status and resolving delirium. Plan to  transfer to progressive care this afternoon.   Acute hypoxic respiratory failure from Rt upper lobe community acquired pneumonia and acute pulmonary edema - unchanged pleural effusions bilaterally on CXR today - decreasing O2 demand - d/c Bipap - supplemental O2 for goal SpO2>92% - hold lasix  A.fib with RVR Acute on Chronic CHF Hypertension - cardiology following, appreciate recs - up-titrate metoprolol 37.5 mg BID PO - continue eliquis, lipitor - amiodarone 200 mg BID PO - digoxin  PO - start home aldactone PO - telemetry  Mild Delirium - improved orientation and sleep-wake disturbance - plan to remove central line today pending additional PIV placement - melatonin 3mg  QHS  Dysphagia - ST assessment - Dysphagia 3, nectar thick liquid - meds PO  Deconditioning - PT/OT - Pt would do well with inpatient rehab prior to d/c home  Hypokalemia - K 3.3, repleted - f/u BMP  Hearing Loss - poor hearing at baseline, appears to be an acute worsening this morning - f/u in AM  Hx Hypothyroidism - Continue home synthroid  Anemia of critical illness - stable at 8.7 this AM - transfuse for Hb < 7 or significant bleeding  Essential tremor - primidone dose decreased on 3/18  Urine retention - remove foley today - f/u urine output  Best practice (evaluated daily)  Diet: Thickened Liquids Pain/Anxiety/Delirium protocol (if indicated): No VAP protocol (if indicated): Not indicated DVT prophylaxis: Systemic AC GI prophylaxis: N/A Glucose control:  SSI No Central venous access:  Yes, to be removed today Arterial line:  N/A Foley:  Yes, and it is no longer needed. Being removed today. Mobility:  bed rest  PT consulted: Yes Last date of multidisciplinary goals of care discussion: 08/22/20 Code Status:  full code Disposition: Progressive Care   08/24/20, Medical Student 08/22/2020 , 9:26 AM

## 2020-08-23 ENCOUNTER — Inpatient Hospital Stay (HOSPITAL_COMMUNITY): Payer: Medicare HMO

## 2020-08-23 DIAGNOSIS — I4891 Unspecified atrial fibrillation: Secondary | ICD-10-CM | POA: Diagnosis not present

## 2020-08-23 DIAGNOSIS — J189 Pneumonia, unspecified organism: Secondary | ICD-10-CM | POA: Diagnosis not present

## 2020-08-23 DIAGNOSIS — G9341 Metabolic encephalopathy: Secondary | ICD-10-CM

## 2020-08-23 DIAGNOSIS — Z515 Encounter for palliative care: Secondary | ICD-10-CM

## 2020-08-23 DIAGNOSIS — J9621 Acute and chronic respiratory failure with hypoxia: Secondary | ICD-10-CM | POA: Diagnosis not present

## 2020-08-23 DIAGNOSIS — Z66 Do not resuscitate: Secondary | ICD-10-CM

## 2020-08-23 LAB — TROPONIN I (HIGH SENSITIVITY): Troponin I (High Sensitivity): 15 ng/L (ref <18)

## 2020-08-23 LAB — BLOOD GAS, ARTERIAL
Acid-Base Excess: 0 mmol/L (ref 0.0–2.0)
Bicarbonate: 26.1 mmol/L (ref 20.0–28.0)
Drawn by: 164
FIO2: 100
O2 Saturation: 90.2 %
Patient temperature: 36.9
pCO2 arterial: 58.2 mmHg — ABNORMAL HIGH (ref 32.0–48.0)
pH, Arterial: 7.273 — ABNORMAL LOW (ref 7.350–7.450)
pO2, Arterial: 70.9 mmHg — ABNORMAL LOW (ref 83.0–108.0)

## 2020-08-23 LAB — LACTIC ACID, PLASMA: Lactic Acid, Venous: 2.4 mmol/L (ref 0.5–1.9)

## 2020-08-23 LAB — BRAIN NATRIURETIC PEPTIDE: B Natriuretic Peptide: 1100.3 pg/mL — ABNORMAL HIGH (ref 0.0–100.0)

## 2020-08-23 MED ORDER — POTASSIUM CHLORIDE CRYS ER 20 MEQ PO TBCR
40.0000 meq | EXTENDED_RELEASE_TABLET | ORAL | Status: DC
  Filled 2020-08-23: qty 2

## 2020-08-23 MED ORDER — GLYCOPYRROLATE 0.2 MG/ML IJ SOLN: 0.2000 mg | INTRAMUSCULAR | Status: DC | PRN

## 2020-08-23 MED ORDER — LORAZEPAM 1 MG PO TABS: 1.0000 mg | ORAL_TABLET | ORAL | Status: DC | PRN

## 2020-08-23 MED ORDER — POLYVINYL ALCOHOL 1.4 % OP SOLN
1.0000 [drp] | Freq: Four times a day (QID) | OPHTHALMIC | Status: DC | PRN
  Filled 2020-08-23: qty 15

## 2020-08-23 MED ORDER — HALOPERIDOL LACTATE 5 MG/ML IJ SOLN: 0.5000 mg | INTRAMUSCULAR | Status: DC | PRN

## 2020-08-23 MED ORDER — LORAZEPAM 2 MG/ML IJ SOLN: 0.5000 mg | INTRAMUSCULAR | Status: DC | PRN

## 2020-08-23 MED ORDER — MORPHINE SULFATE (PF) 2 MG/ML IV SOLN: 2.0000 mg | INTRAVENOUS | Status: DC | PRN

## 2020-08-23 MED ORDER — LORAZEPAM 2 MG/ML PO CONC: 1.0000 mg | ORAL | Status: DC | PRN

## 2020-08-23 MED ORDER — GLYCOPYRROLATE 1 MG PO TABS: 1.0000 mg | ORAL_TABLET | ORAL | Status: DC | PRN

## 2020-08-23 MED ORDER — HALOPERIDOL LACTATE 2 MG/ML PO CONC
0.5000 mg | ORAL | Status: DC | PRN
  Filled 2020-08-23: qty 0.3

## 2020-08-23 MED ORDER — MORPHINE 100MG IN NS 100ML (1MG/ML) PREMIX INFUSION: 1.0000 mg/h | INTRAVENOUS | Status: DC

## 2020-08-23 MED ORDER — ATROPINE SULFATE 1 % OP SOLN
1.0000 [drp] | Freq: Four times a day (QID) | OPHTHALMIC | Status: DC | PRN
  Filled 2020-08-23: qty 2

## 2020-08-23 MED ORDER — LORAZEPAM 2 MG/ML IJ SOLN: 1.0000 mg | INTRAMUSCULAR | Status: DC | PRN

## 2020-08-23 MED ORDER — HALOPERIDOL 1 MG PO TABS: 0.5000 mg | ORAL_TABLET | ORAL | Status: DC | PRN

## 2020-08-23 MED ORDER — FUROSEMIDE 10 MG/ML IJ SOLN: 60.0000 mg | Freq: Once | INTRAMUSCULAR | Status: DC

## 2020-08-23 MED ORDER — ONDANSETRON HCL 4 MG/2ML IJ SOLN: 4.0000 mg | Freq: Four times a day (QID) | INTRAMUSCULAR | Status: DC | PRN

## 2020-08-23 MED ORDER — MORPHINE SULFATE (PF) 2 MG/ML IV SOLN
2.0000 mg | INTRAVENOUS | Status: DC | PRN
  Administered 2020-08-23: 2 mg via INTRAVENOUS
  Administered 2020-08-23: 4 mg via INTRAVENOUS
  Filled 2020-08-23: qty 2
  Filled 2020-08-23: qty 1

## 2020-08-23 MED ORDER — ONDANSETRON 4 MG PO TBDP: 4.0000 mg | ORAL_TABLET | Freq: Four times a day (QID) | ORAL | Status: DC | PRN

## 2020-08-29 NOTE — Progress Notes (Signed)
   08/16/2020 1000  Clinical Encounter Type  Visited With Patient and family together  Visit Type Patient actively dying  Referral From Nurse  Consult/Referral To Chaplain  Spiritual Encounters  Spiritual Needs Emotional;Grief support;Prayer  The chaplain responded to page because the patient is actively dying. The husband was at bedside and requested prayer. The husband stated the patient was a strong woman of faith. The husband spoke to me concerning he and his wife's 68 year marriage. The patient expressed the need for social and emotional support. The chaplain provided a ministry of presence and offered a prayer before leaving. The chaplain will follow up as needed.

## 2020-08-29 NOTE — Death Summary Note (Signed)
DEATH SUMMARY   Patient Details  Name: Katherine Ewing MRN: [id number] DOB: 11/04/1930  Admission/Discharge Information   Admit Date:  Sep 09, 2020  Date of Death: Date of Death: 19-Sep-2020  Time of Death: Time of Death: 10-24-49  Length of Stay: 05-Oct-2022  Referring Physician: Randel Pigg, Dorma Russell, MD   Reason(s) for Hospitalization  Malaise, myalgia, cough and decreased appetite  Diagnoses  Preliminary cause of death: Acute hypoxemic respiratory failure (HCC) Secondary Diagnoses (including complications and co-morbidities):  Principal Problem:   Community acquired pneumonia Active Problems:   Acute on chronic systolic CHF (congestive heart failure) (HCC)   Essential hypertension   Atrial fibrillation with RVR (HCC)   Hypotension   Dehydration, moderate   CAP (community acquired pneumonia)   Severe sepsis with septic shock (HCC)   Acute hypoxemic respiratory failure (HCC)   DNR (do not resuscitate)   End of life care   Acute metabolic encephalopathy   Brief Hospital Course (including significant findings, care, treatment, and services provided and events leading to death)  Katherine Ewing is a 87 y.o. year old female with history of Paroxysmal atrial fibrillation, Chronic systolic CHF, HTN, HLD and Hypothyroidism presented with malaise, myalgias, cough, and decreased appetite.  Found to severe sepsis with acute hypoxemic respiratory failure in the setting of right upper lobe pneumonia.  She developed acute metabolic encephalopathy, A fib with RVR and hypotension concerning for septic shock.  She was intubated and started on pressors and transferred to ICU on 3/18.  She came off pressor and extubated on 3/22.  Mental status reportedly improved, and she was transferred to progressive care on 3/25.  Patient developed respiratory decline with encephalopathy.  After discussion with patient's husband, patient husband declined intubation and mechanical ventilation and requested emphasis on patient's  comfort.   Pertinent Labs and Studies  Significant Diagnostic Studies DG Chest 1 View  Result Date: 08/20/2020 CLINICAL DATA:  Dyspnea, cough EXAM: CHEST  1 VIEW COMPARISON:  08/19/2020 FINDINGS: Interval extubation. Pulmonary insufflation has decreased and lung volumes are small, though symmetric. Focal pulmonary infiltrate has developed within the right perihilar region, possibly infectious or related to aspiration in the supine patient. Small bilateral pleural effusions are again noted with bibasilar compressive atelectasis. Left internal jugular central venous catheter tip is unchanged within the superior vena cava. Mild cardiomegaly is stable. Pulmonary vascularity is normal. No acute bone abnormality. IMPRESSION: Interval extubation with decreased pulmonary insufflation. Small lung volumes. Interval development of a focal pulmonary infiltrate within the right perihilar region, possibly related to infection or aspiration. Small bilateral pleural effusions with bibasilar compressive atelectasis. Electronically Signed   By: Helyn Numbers MD   On: 08/20/2020 12:26   DG Chest Port 1 View  Result Date: 09/19/20 CLINICAL DATA:  Community-acquired pneumonia EXAM: PORTABLE CHEST 1 VIEW COMPARISON:  08/23/2018 FINDINGS: Opacity overlying the right hemithorax is at least partially related to an overlying blanket or skin fold. However, there is also persistent multifocal pneumonia in the lungs bilaterally, stable versus mildly progressive. Suspected small bilateral pleural effusions. No pneumothorax. The heart is normal in size.  Thoracic aortic atherosclerosis. Interval removal of the left IJ venous catheter. Degenerative changes of the lumbar spine. IMPRESSION: Multifocal pneumonia, stable versus mildly progressive. Suspected small bilateral pleural effusions. Electronically Signed   By: Charline Bills M.D.   On: 09/19/20 10:39   DG Chest Port 1 View  Result Date: 08/22/2020 CLINICAL DATA:  Acute  on chronic respiratory failure with hypoxia EXAM: PORTABLE CHEST 1 VIEW COMPARISON:  08/20/2020, CT 07/06/2018 FINDINGS: Left IJ approach central venous catheter tip terminates at the left brachiocephalic-superior caval confluence. Right heart border is partially obscured by adjacent opacity. Grossly stable cardiomediastinal contours with cardiomegaly and a calcified tortuous aorta. Stable bilateral pleural effusions with adjacent opacity likely reflecting some passive atelectasis. Additional patchy areas of consolidation elsewhere in both lungs, right greater than left, likely infectious or inflammatory. No pneumothorax. Pulmonary vascularity is relatively normally distributed. No other acute osseous or soft tissue abnormality. IMPRESSION: 1. Left IJ approach central venous catheter tip terminates at the left brachiocephalic-superior caval confluence. 2. Stable bilateral pleural effusions with adjacent opacity likely reflecting some passive atelectasis. 3. Additional patchy opacities likely reflect some superimposed infection or inflammation, similar to prior. Electronically Signed   By: Kreg Shropshire M.D.   On: 08/22/2020 06:44   DG CHEST PORT 1 VIEW  Result Date: 08/20/2020 CLINICAL DATA:  Hypoxia, atrial fibrillation EXAM: PORTABLE CHEST 1 VIEW COMPARISON:  10:40 a.m. FINDINGS: Lung volumes are small, but are symmetric and are stable since prior examination. Small bilateral pleural effusions are again identified, right greater than left, with associated bibasilar compressive atelectasis. Superimposed pulmonary infiltrate within the right perihilar and lower lung zone appears stable since prior examination, possibly related to underlying infection or aspiration. No pneumothorax. Left internal jugular central venous catheter tip again noted overlying the expected superior vena cava. Mild cardiomegaly is stable. Pulmonary vascularity is normal. IMPRESSION: Stable bilateral pleural effusions, right greater than  left, with associated bibasilar compressive atelectasis. Stable asymmetric right perihilar and lower lung zone pulmonary infiltrate, possibly related to infection or aspiration. Electronically Signed   By: Helyn Numbers MD   On: 08/20/2020 12:28   DG Chest Port 1 View  Result Date: 08/19/2020 CLINICAL DATA:  Respiratory failure. EXAM: PORTABLE CHEST 1 VIEW COMPARISON:  08/18/2020. FINDINGS: Endotracheal tube tip again noted 2 cm from the carina. NG tube noted with tip below left hemidiaphragm. Left central line stable position. Stable cardiomegaly. Bilateral infiltrates/edema, right side greater than left again noted. Slight interim improvement. Bilateral small pleural effusions again noted. No pneumothorax. IMPRESSION: 1. Lines and tubes in stable position. 2. Stable cardiomegaly. 3. Bilateral infiltrates/edema, right side greater than left again noted. Slight interim improvement. Bilateral small pleural effusions again noted. Electronically Signed   By: Maisie Fus  Register   On: 08/19/2020 05:09   DG Chest Port 1 View  Result Date: 08/18/2020 CLINICAL DATA:  Respiratory failure EXAM: PORTABLE CHEST 1 VIEW COMPARISON:  08/17/2020, CT 07/06/2018 FINDINGS: Endotracheal tube tip terminates low in the trachea, 2 cm from the carina. Transesophageal tube tip and side port distal to the GE junction, beyond the margins of imaging. Left IJ approach central venous catheter tip terminates at the left brachiocephalic-caval confluence. Additional external support devices and telemetry leads overlie the chest. Stable cardiomegaly with a calcified, tortuous aorta. Persistent opacity throughout the right hemithorax likely a combination of layering pleural effusion and superimposed patchy airspace disease and/or atelectasis. A smaller layering left effusion with more mild airspace opacity noted in the left mid to lower lung as well. Overall extent of disease is similar to prior. No acute osseous or soft tissue abnormality.  Degenerative changes are present in the imaged spine and shoulders. IMPRESSION: 1. Endotracheal tube tip terminates low in the trachea, 2 cm from the carina. Consider retraction 2 cm to the mid trachea. 2. Transesophageal tube tip and side port distal to the GE junction. 3. Stable bilateral effusions with airspace disease and/or atelectasis, right greater  than left. These results will be called to the ordering clinician or representative by the Radiologist Assistant, and communication documented in the PACS or [employer]. Electronically Signed   By: Kreg Shropshire M.D.   On: 08/18/2020 06:37   DG Chest Port 1 View  Result Date: 08/17/2020 CLINICAL DATA:  Respiratory failure. EXAM: PORTABLE CHEST 1 VIEW COMPARISON:  Prior chest radiographs 08/16/2020 and earlier. FINDINGS: ET tube terminating 3 cm above the level of the carina. An enteric tube passes below the level of the left hemidiaphragm with tip excluded from the field of view. A left IJ approach central venous catheter is present with tip projecting in the region of the brachiocephalic/SVC confluence. Unchanged cardiomegaly. Aortic atherosclerosis. Hazy gradient opacity within the right thorax compatible layering pleural effusion. Superimposed patchy airspace disease within the right lung. Increased small left pleural effusion and associated left basilar atelectasis and/or consolidation. IMPRESSION: Layering pleural effusion on the right, increased as compared to the prior exam of 08/16/2020. Superimposed patchy airspace disease within the right lung, similar to the prior examination. Increased small left pleural effusion and associated left basilar atelectasis and/or consolidation. Electronically Signed   By: Jackey Loge DO   On: 08/17/2020 07:51   DG Chest Port 1 View  Result Date: 08/16/2020 CLINICAL DATA:  Respiratory failure, atrial fibrillation, pneumonia EXAM: PORTABLE CHEST 1 VIEW COMPARISON:  Radiograph 08/15/2020 FINDINGS: Endotracheal  tube tip terminates approximately 3.2 cm from the carina. Left IJ approach central venous catheter tip terminates near the left brachiocephalic-caval confluence. Transesophageal tube tip and side port terminate distal to the GE junction. Additional external support devices and telemetry leads overlie the chest. Bilateral pleural effusions, likely increased on the right with adjacent areas of passive atelectasis and more patchy airspace opacity in the right upper lobe. No pneumothorax. Cardiomegaly is similar to comparisons with a calcified, tortuous aorta. No acute osseous or soft tissue abnormality. IMPRESSION: 1. Bilateral pleural effusions, likely increased on the right with adjacent areas of passive atelectasis. 2. Patchy airspace opacity in the right upper lobe is similar to slightly decreased from prior. 3. Lines and tubes as above. Electronically Signed   By: Kreg Shropshire M.D.   On: 08/16/2020 06:45   DG CHEST PORT 1 VIEW  Result Date: 08/15/2020 CLINICAL DATA:  Central line placement. EXAM: PORTABLE CHEST 1 VIEW COMPARISON:  Earlier today. FINDINGS: Left internal jugular central venous catheter tip in the region of the mid SVC. No pneumothorax. Endotracheal tube tip is 3.8 cm from the carina. Tip and side port of the enteric tube below the diaphragm in the stomach. Stable cardiomegaly. No significant change in right upper lobe opacity. There is progressive bibasilar opacity in volume loss, suggesting atelectasis and pleural effusions. Stable osseous structures. IMPRESSION: 1. Tip of the left internal jugular central venous catheter in the region of the mid SVC. No pneumothorax. 2. Progressive bibasilar opacity, suggesting atelectasis and pleural effusions. Stable right upper lobe opacity. Electronically Signed   By: Narda Rutherford M.D.   On: 08/15/2020 18:07   DG CHEST PORT 1 VIEW  Result Date: 08/15/2020 CLINICAL DATA:  87 year old female with shortness of breath. Intubated. EXAM: PORTABLE CHEST 1  VIEW COMPARISON:  Portable chest 08/14/2020 and earlier. FINDINGS: Portable AP semi upright view at 0805 hours. Endotracheal tube tip in good position between the level the clavicles and carina. Enteric tube placed into the stomach, side hole at the level of the gastric fundus. Larger lung volumes. Partial right upper lobe consolidation has not  significantly changed. But there is evidence of increased lower lobe collapse or consolidation, now obscuring the left hemidiaphragm. Stable cardiac size and mediastinal contours. No pneumothorax or pulmonary edema. Evidence of small right pleural effusion. No acute osseous abnormality identified. IMPRESSION: 1. Endotracheal tube and enteric tube in good position. 2. Larger lung volumes with stable right upper lobe pneumonia but increased bilateral lower lobe collapse or consolidation. Small right pleural effusion suspected. Electronically Signed   By: Odessa Fleming M.D.   On: 08/15/2020 08:33   DG Chest Port 1 View  Result Date: 08/14/2020 CLINICAL DATA:  Dyspnea. EXAM: PORTABLE CHEST 1 VIEW COMPARISON:  Chest x-ray from yesterday. FINDINGS: Unchanged mild cardiomegaly. Mildly improved right upper lobe consolidation. No pneumothorax or pleural effusion. No acute osseous abnormality. IMPRESSION: 1. Mildly improved right upper lobe pneumonia. Electronically Signed   By: Obie Dredge M.D.   On: 08/14/2020 17:00   DG Chest Port 1 View  Result Date: 09-08-2020 CLINICAL DATA:  Cough for 2 weeks, fever, atrial fibrillation, question sepsis EXAM: PORTABLE CHEST 1 VIEW COMPARISON:  Portable exam 1224 hours compared to 06/22/2020 FINDINGS: Enlargement of cardiac silhouette. Mediastinal contours and pulmonary vascularity normal. Atherosclerotic calcification aorta. New RIGHT upper lobe infiltrate consistent with pneumonia. Remaining lungs clear. No pleural effusion or pneumothorax. Bones demineralized with dextroconvex thoracolumbar scoliosis. IMPRESSION: Enlargement of cardiac  silhouette. New RIGHT upper lobe infiltrate consistent with pneumonia. Aortic Atherosclerosis (ICD10-I70.0). Electronically Signed   By: Ulyses Southward M.D.   On: 09-08-20 12:34   DG Swallowing Func-Speech Pathology  Result Date: 08/20/2020 Objective Swallowing Evaluation: Type of Study: MBS-Modified Barium Swallow Study  Patient Details Name: Analie Katzman MRN: [id number] Date of Birth: 11/20/1930 Today's Date: 08/20/2020 Time: SLP Start Time (ACUTE ONLY): 1030 -SLP Stop Time (ACUTE ONLY): 1044 SLP Time Calculation (min) (ACUTE ONLY): 14 min Past Medical History: Past Medical History: Diagnosis Date . Hypertension  . MVP (mitral valve prolapse)  Past Surgical History: Past Surgical History: Procedure Laterality Date . ABDOMINAL HYSTERECTOMY   . APPENDECTOMY   . CHOLECYSTECTOMY   . COLONOSCOPY WITH PROPOFOL N/A 02/01/2020  Procedure: COLONOSCOPY WITH PROPOFOL;  Surgeon: Jeani Hawking, MD;  Location: [facility];  Service: Endoscopy;  Laterality: N/A; . POLYPECTOMY  02/01/2020  Procedure: POLYPECTOMY;  Surgeon: Jeani Hawking, MD;  Location: [facility] END[facility]COPY;  Service: Endoscopy;; HPI: 87 y.o. female presented [facility] ED on 08-Sep-2020 with 3 days of fatigue, chills, cough. Pt found to be tachy and hypotensive in ED. CXR in ED showed RUL PNA.  No hx of dysphagia per pt/husband at that time. Underwent clinical swallow evaluation on 3/17, no concerns for dysphagia. Oral-motor examination was unremarkable with intact dentition. Self administered multiple sips water via cup/straw and regular texture without indications of aspiration. Respirations were normal without audible congestion. Therapist upgraded texture from clears to regular, thin liquids. No further ST f/u was recommended. Overnight and morning of 3/18 she became obtunded with severe respiratory distress requiring intubation was transferred to the ICU.  Extubated 3/22. PMH includes HTN, MV prolapse, PAF on eliquis, chronic syst CHF, hypothyroidism, thrombocytopenia.   Subjective: "what, I can't hear you" Assessment / Plan / Recommendation CHL IP CLINICAL IMPRESSIONS 08/20/2020 Clinical Impression Pt presents with oropharyngeal dysphagia characterized by impaired mastication, reduced posterior bolus propulsion, a pharyngeal delay, and reduced anterior laryngeal movement. She demonstrated lingual pumping, prolonged mastication of regular textures, penetration (PAS 5) and aspiration (PAS 7,8) of thin liquids. Laryngeal invasion was secondary to pharyngeal delay and was less frequently noted with  thin liquids via cup. Pt required encouragement to participate in the  study and exhibited difficulty demonstrating compensatory strategies. She frequently stated, "That's not the medicine I take" despite education regarding the purpose of the study. A dysphagia 2 diet with nectar thick liquids is recommended at this time. SLP will follow for dysphagia treatment. SLP Visit Diagnosis Dysphagia, oropharyngeal phase (R13.12) Attention and concentration deficit following -- Frontal lobe and executive function deficit following -- Impact on safety and function Moderate aspiration risk   CHL IP TREATMENT RECOMMENDATION 08/20/2020 Treatment Recommendations Therapy as outlined in treatment plan below   Prognosis 08/20/2020 Prognosis for Safe Diet Advancement Fair Barriers to Reach Goals Cognitive deficits Barriers/Prognosis Comment -- CHL IP DIET RECOMMENDATION 08/20/2020 SLP Diet Recommendations Dysphagia 2 (Fine chop) solids;Nectar thick liquid Liquid Administration via Cup;Straw Medication Administration Whole meds with liquid Compensations Small sips/bites;Slow rate;Minimize environmental distractions Postural Changes Seated upright at 90 degrees   CHL IP OTHER RECOMMENDATIONS 08/20/2020 Recommended Consults -- Oral Care Recommendations Oral care BID Other Recommendations Order thickener from pharmacy   CHL IP FOLLOW UP RECOMMENDATIONS 08/20/2020 Follow up Recommendations Other (comment)   CHL IP  FREQUENCY AND DURATION 08/20/2020 Speech Therapy Frequency (ACUTE ONLY) min 2x/week Treatment Duration 2 weeks      CHL IP ORAL PHASE 08/20/2020 Oral Phase Impaired Oral - Pudding Teaspoon -- Oral - Pudding Cup -- Oral - Honey Teaspoon -- Oral - Honey Cup -- Oral - Nectar Teaspoon -- Oral - Nectar Cup -- Oral - Nectar Straw Lingual pumping;Reduced posterior propulsion Oral - Thin Teaspoon -- Oral - Thin Cup Lingual pumping;Reduced posterior propulsion Oral - Thin Straw Lingual pumping;Reduced posterior propulsion Oral - Puree Lingual pumping;Reduced posterior propulsion Oral - Mech Soft -- Oral - Regular Lingual pumping;Reduced posterior propulsion;Impaired mastication Oral - Multi-Consistency -- Oral - Pill -- Oral Phase - Comment --  CHL IP PHARYNGEAL PHASE 08/20/2020 Pharyngeal Phase Impaired Pharyngeal- Pudding Teaspoon -- Pharyngeal -- Pharyngeal- Pudding Cup -- Pharyngeal -- Pharyngeal- Honey Teaspoon -- Pharyngeal -- Pharyngeal- Honey Cup -- Pharyngeal -- Pharyngeal- Nectar Teaspoon -- Pharyngeal -- Pharyngeal- Nectar Cup -- Pharyngeal -- Pharyngeal- Nectar Straw Reduced anterior laryngeal mobility;Delayed swallow initiation-vallecula;Delayed swallow initiation-pyriform sinuses Pharyngeal -- Pharyngeal- Thin Teaspoon -- Pharyngeal -- Pharyngeal- Thin Cup Reduced anterior laryngeal mobility;Delayed swallow initiation-vallecula;Delayed swallow initiation-pyriform sinuses;Penetration/Aspiration during swallow Pharyngeal Material enters airway, remains ABOVE vocal cords and not ejected out Pharyngeal- Thin Straw Reduced anterior laryngeal mobility;Delayed swallow initiation-vallecula;Delayed swallow initiation-pyriform sinuses;Penetration/Aspiration during swallow;Penetration/Apiration after swallow Pharyngeal Material enters airway, CONTACTS cords and not ejected out;Material enters airway, passes BELOW cords and not ejected out despite cough attempt by patient;Material enters airway, passes BELOW cords without  attempt by patient to eject out (silent aspiration) Pharyngeal- Puree Reduced anterior laryngeal mobility;Delayed swallow initiation-vallecula;Delayed swallow initiation-pyriform sinuses Pharyngeal -- Pharyngeal- Mechanical Soft -- Pharyngeal -- Pharyngeal- Regular Reduced anterior laryngeal mobility;Delayed swallow initiation-vallecula Pharyngeal -- Pharyngeal- Multi-consistency -- Pharyngeal -- Pharyngeal- Pill Reduced anterior laryngeal mobility;Delayed swallow initiation-vallecula Pharyngeal -- Pharyngeal Comment --  CHL IP CERVICAL ESOPHAGEAL PHASE 08/20/2020 Cervical Esophageal Phase WFL Pudding Teaspoon -- Pudding Cup -- Honey Teaspoon -- Honey Cup -- Nectar Teaspoon -- Nectar Cup -- Nectar Straw -- Thin Teaspoon -- Thin Cup -- Thin Straw -- Puree -- Mechanical Soft -- Regular -- Multi-consistency -- Pill -- Cervical Esophageal Comment -- Shanika I. Vear Clock, MS, CCC-SLP Acute Rehabilitation Services Office number [phone] Pager [phone] Scheryl Marten 08/20/2020, 12:08 PM              ECHOCARDIOGRAM COMPLETE  Result Date: 08/18/2020    ECHOCARDIOGRAM REPORT   Patient Name:   Katherine Ewing Date of Exam: 08/18/2020 Medical Rec #:  [id number]   Height:       62.0 in Accession #:    [id number]  Weight:       166.0 lb Date of Birth:  1930/10/23   BSA:          1.766 m Patient Age:    87 years    BP:           110/68 mmHg Patient Gender: F           HR:           106 bpm. Exam Location:  Inpatient Procedure: 2D Echo, Cardiac Doppler and Color Doppler Indications:    CHF  History:        Patient has no prior history of Echocardiogram examinations.                 CHF, Arrythmias:Atrial Fibrillation, Signs/Symptoms:Altered                 Mental Status; Risk Factors:Hypertension and Dyslipidemia.                 Pneumonia, resp, failure.  Sonographer:    Lavenia Atlas Referring Phys: Val Riles SOOD  Sonographer Comments: Echo performed with patient supine and on artificial respirator. IMPRESSIONS  1.  Endocardium is difficult to see. Overall LVEF is probably moderately depressed with hypokineis of the septum, anterior, inferior and apical wall segments Would recomm limited echo with Definity to further define wall motion and LVEF. The left ventricle demonstrates global hypokinesis. Left ventricular diastolic parameters are indeterminate.  2. Right ventricular systolic function is low normal. The right ventricular size is normal. There is normal pulmonary artery systolic pressure.  3. Right atrial size was severely dilated.  4. Mild mitral valve regurgitation.  5. The aortic valve is tricuspid. Aortic valve regurgitation is trivial.  6. The inferior vena cava is dilated in size with <50% respiratory variability, suggesting right atrial pressure of 15 mmHg. FINDINGS  Left Ventricle: Endocardium is difficult to see. Overall LVEF is probably moderately depressed with hypokineis of the septum, anterior, inferior and apical wall segments Would recomm limited echo with Definity to further define wall motion and LVEF. The  left ventricle demonstrates global hypokinesis. The left ventricular internal cavity size was normal in size. There is no left ventricular hypertrophy. Left ventricular diastolic parameters are indeterminate. Right Ventricle: The right ventricular size is normal. Right vetricular wall thickness was not assessed. Right ventricular systolic function is low normal. There is normal pulmonary artery systolic pressure. The tricuspid regurgitant velocity is 2.78 m/s, and with an assumed right atrial pressure of 3 mmHg, the estimated right ventricular systolic pressure is 33.9 mmHg. Left Atrium: Left atrial size was normal in size. Right Atrium: Right atrial size was severely dilated. Pericardium: There is no evidence of pericardial effusion. Mitral Valve: There is mild thickening of the mitral valve leaflet(s). Mild mitral valve regurgitation. Tricuspid Valve: The tricuspid valve is normal in structure.  Tricuspid valve regurgitation is mild. Aortic Valve: The aortic valve is tricuspid. Aortic valve regurgitation is trivial. Pulmonic Valve: The pulmonic valve was grossly normal. Pulmonic valve regurgitation is not visualized. Aorta: The aortic root is normal in size and structure. Venous: The inferior vena cava is dilated in size with less than 50% respiratory variability, suggesting right atrial pressure of 15 mmHg.  LEFT VENTRICLE PLAX 2D  LVIDd:         3.90 cm     Diastology LVIDs:         2.90 cm     LV e' medial:    7.94 cm/s LV PW:         1.00 cm     LV E/e' medial:  9.5 LV IVS:        1.00 cm     LV e' lateral:   10.00 cm/s LVOT diam:     2.00 cm     LV E/e' lateral: 7.5 LV SV:         31 LV SV Index:   17 LVOT Area:     3.14 cm  LV Volumes (MOD) LV vol d, MOD A4C: 62.7 ml LV vol s, MOD A4C: 28.2 ml LV SV MOD A4C:     62.7 ml RIGHT VENTRICLE RV Basal diam:  3.50 cm RV S prime:     4.68 cm/s TAPSE (M-mode): 1.9 cm LEFT ATRIUM           Index       RIGHT ATRIUM           Index LA diam:      4.10 cm 2.32 cm/m  RA Area:     21.50 cm LA Vol (A4C): 50.6 ml 28.65 ml/m RA Volume:   74.50 ml  42.18 ml/m  AORTIC VALVE LVOT Vmax:   59.40 cm/s LVOT Vmean:  37.000 cm/s LVOT VTI:    0.097 m  AORTA Ao Root diam: 3.30 cm MITRAL VALVE               TRICUSPID VALVE MV Area (PHT): 5.02 cm    TR Peak grad:   30.9 mmHg MV Decel Time: 151 msec    TR Vmax:        278.00 cm/s MV E velocity: 75.40 cm/s MV A velocity: 21.40 cm/s  SHUNTS MV E/A ratio:  3.52        Systemic VTI:  0.10 m                            Systemic Diam: 2.00 cm Dietrich PatesPaula Ross MD Electronically signed by Dietrich PatesPaula Ross MD Signature Date/Time: 08/18/2020/5:50:17 PM    Final     Microbiology Recent Results (from the past 240 hour(s))  Urine culture     Status: None   Collection Time: 08/15/20  8:47 AM   Specimen: In/Out Cath Urine  Result Value Ref Range Status   Specimen Description IN/OUT CATH URINE  Final   Special Requests NONE  Final   Culture   Final     NO GROWTH Performed at [facility], [address] [place], [place] [address]    Report Status 08/16/2020 FINAL  Final  MRSA PCR Screening     Status: None   Collection Time: 08/15/20  8:47 AM   Specimen: Nasopharyngeal  Result Value Ref Range Status   MRSA by PCR NEGATIVE NEGATIVE Final    Comment:        The GeneXpert MRSA Assay (FDA approved for NASAL specimens only), is one component of a comprehensive MRSA colonization surveillance program. It is not intended to diagnose MRSA infection nor to guide or monitor treatment for MRSA infections. Performed at [facility], [address] [place], [place] [address]   Culture, Respiratory w Gram Stain     Status: None  Collection Time: 08/15/20  4:56 PM   Specimen: Endotracheal; Respiratory  Result Value Ref Range Status   Specimen Description ENDOTRACHEAL  Final   Special Requests NONE  Final   Gram Stain   Final    MODERATE WBC PRESENT, PREDOMINANTLY PMN NO ORGANISMS SEEN    Culture   Final    NO GROWTH 2 DAYS Performed at [facility], [address], [place], [place] [address]    Report Status 08/18/2020 FINAL  Final    Lab Basic Metabolic Panel: Recent Labs  Lab 08/18/20 0324 08/19/20 0334 08/20/20 0518 08/21/20 0514 08/22/20 0613  NA 136 137 139 138 137  K 3.9 3.7 4.1 3.7 3.3*  CL 106 105 106 103 100  CO2 GLUCOSE 145* 152* 130* 127* 78  BUN 22 25* CREATININE 0.76 0.68 0.66 0.72 0.73  CALCIUM 7.7* 7.8* 8.0* 8.4* 8.3*  MG  --   --   --   --  2.0  PHOS  --  3.1  --   --   --    Liver Function Tests: No results for input(s): AST, ALT, ALKPHOS, BILITOT, PROT, ALBUMIN in the last 168 hours. No results for input(s): LIPASE, AMYLASE in the last 168 hours. No results for input(s): AMMONIA in the last 168 hours. CBC: Recent Labs  Lab 08/18/20 0324 08/19/20 0334 08/20/20 0518 08/21/20 0514 08/22/20 0613  WBC 6.7 6.0 7.9 9.5 10.3  HGB 9.0* 8.9* 8.7* 9.2*  9.5*  HCT 28.4* 26.9* 27.5* 28.7* 29.4*  MCV 98.3 97.5 98.6 97.6 98.3  PLT 204 187 205 239 255   Cardiac Enzymes: No results for input(s): CKTOTAL, CKMB, CKMBINDEX, TROPONINI in the last 168 hours. Sepsis Labs: Recent Labs  Lab 08/19/20 0334 08/20/20 0518 08/21/20 0514 08/22/20 0613 2020/09/08 0914  WBC 6.0 7.9 9.5 10.3  --   LATICACIDVEN  --   --   --   --  2.4*    Procedures/Operations  Intubation and mechanical ventilation from 08/15/2020-08/19/2020   Almon Hercules 09/08/20, 10:37 PM

## 2020-08-29 NOTE — Progress Notes (Signed)
When I arrived for shift change and was at bedside for report. Pt looked comfortable . @ around 0740 Pts spouse stated he felt like pt was having a hard time breathing. I asked for assistance from my CN, whom help assess the pt and give spouse reassurance. Pt placed on NRB with stable SPO2, but increased WOB. MD was notified of the situation, as well as Rapid Response. Both teams arrived. MD paged for Critical Care.   Once critical care arrived. Team spoke with spouse and answered all questions. Pt was placed on comfort care.  @1200  Spouse and Pt agreed they did not want anymore medication , nor did pt want to continue on NRB and removed by pt with spouse at bedside agreeable.   Pt passed at 1251

## 2020-08-29 NOTE — Progress Notes (Signed)
Brief cardiology note: Patient has transitioned to comfort care after acute decline. Cardiology will be available for any questions or concerns.  Jodelle Red, MD, PhD, [facility]  [facility]  [address] [place], [place] [address] [phone]

## 2020-08-29 NOTE — Significant Event (Signed)
Rapid Response Event Note   Reason for Call :  Worsening respiratory status  Initial Focused Assessment:  Received a call from the charge RN asking if I could come evaluate this patient.  The patient was on NRB 16 L sating at 93%. The patient's respiratory pattern and use of accessory muscles indicates that she is in distress.  Her husband is at the bedside and he states that she was doing so well when she got to 2W.  He also stated that the patient would not want to have another breathing tube placed and have to go back on mechanical ventilation.  BP 138/72 ECG 108 RR 41 (16L NRB) o2 93 % Temp 98.4   Interventions:  Vitals taken, chest x-ray, lactic acid, ABG, BMET, and CBC ordered  Plan of Care:  CCM came to evaluate this patient.  The husband was adamant that she would not want to go back on mechanical ventilation.  It was suggested that we make her comfortable, change code status, and make her comfort care.  All are in agreement that this is the best option for this patient so she can pass with peace and dignity.   Event Summary:   MD Notified: MD at bedside.  CCM at bedside Call Time: Arrival Time: End Time:  Andrey Spearman, RN

## 2020-08-29 NOTE — Progress Notes (Signed)
NAME:  Katherine Ewing, MRN:  [id number], DOB:  1931/05/26, LOS: 10 ADMISSION DATE:  2020/08/29, CONSULTATION DATE:  08/15/20 REFERRING MD:  Nelson Chimes CHIEF COMPLAINT:  SOB   History of Present Illness:  87 y/o female presented with malaise, myalgias, cough, and decreased appetite.  Found to have Rt upper lobe CAP and A fib with RVR and hypotension.  Developed respiratory distress with hypoxia and altered mental status on 3/18.  She required intubation, pressors, and transfer to ICU.  Pertinent  Medical History  Paroxysmal atrial fibrillation, Chronic systolic CHF, HTN, HLD. Hypothyroidism  Significant Hospital Events: Including procedures, antibiotic start and stop dates in addition to other pertinent events   3/16 admit 3/17 cardiology consulted 3/18 transfer to ICU, intubated, start pressors 3/19 start amiodarone 3/20 off pressors temporarily 3/22 off pressors, extubated 3/23 improving mental status 3/25 transfer to progressive care 3/26 Respiratory decline, husband elected no ventilation / comfort measures  Interim History / Subjective:  Called to beside by RRT / primary MD > respiratory distress, pulling oxygen off  / agitated delirium  Afebrile  On 100% NRB  Husband at bedside   Objective   Blood pressure (!) 165/71, pulse 65, temperature 98.4 F (36.9 C), temperature source Axillary, resp. rate 20, height 5\' 2"  (1.575 m), weight 70.9 kg, SpO2 94 %.        Intake/Output Summary (Last 24 hours) at 08/09/2020 0933 Last data filed at 07/31/2020 0600 Gross per 24 hour  Intake 648.52 ml  Output 525 ml  Net 123.52 ml   Filed Weights   08/18/20 0329 08/19/20 0500 08/21/20 0500  Weight: 75.3 kg 74.8 kg 70.9 kg    Examination: General: ill appearing adult female lying in bed, paradoxical breathing  HEENT: MM pink/dry, NRB in place, pupils reactive  Neuro: no response to verbal stimuli, pulling at mask / spontaneous movement noted  CV: s1s2 irr irr, AF on monitor, no m/r/g PULM:  paradoxical breathing, rhonchi bilaterally  GI: soft, bsx4 hypoactive  Extremities: trace edema  Skin: cool, mottling noted  Labs/imaging personally reviewed    CMP Latest Ref Rng & Units 08/22/2020 08/21/2020 08/20/2020  Glucose 70 - 99 mg/dL 78 08/22/2020) 630(Z)  BUN 8 - 23 mg/dL 21 23 23   Creatinine 0.44 - 1.00 mg/dL 601(U 9.32  Sodium 135 - 145 mmol/L 137 138 139  Potassium 3.5 - 5.1 mmol/L 3.3(L) 3.7 4.1  Chloride 98 - 111 mmol/L 100 103 106  CO2 22 - 32 mmol/L 29 29 27   Calcium 8.9 - 10.3 mg/dL 8.3(L) 8.4(L) 8.0(L)  Total Protein 6.5 - 8.1 g/dL - - -  Total Bilirubin 0.3 - 1.2 mg/dL - - -  Alkaline Phos 38 - 126 U/L - - -  AST 15 - 41 U/L - - -  ALT 0 - 44 U/L - - -    CBC Latest Ref Rng & Units 08/22/2020 08/21/2020 08/20/2020  WBC 4.0 - 10.5 K/uL 10.3 9.5 7.9  Hemoglobin 12.0 - 15.0 g/dL 08/24/2020) 08/12/2020) 08/22/2020)  Hematocrit 36.0 - 46.0 % 29.4(L) 28.7(L) 27.5(L)  Platelets 150 - 400 K/uL 255 239 205    ABG    Component Value Date/Time   PHART 7.273 (L) 08/22/2020 0905   PCO2ART 58.2 (H) 07/31/2020 0905   PO2ART 70.9 (L) 08/18/2020 0905   HCO3 26.1 08/05/2020 0905   TCO2 24 08/15/2020 1033   ACIDBASEDEF 1.0 08/15/2020 1033   O2SAT 90.2 08/03/2020 0905    CBG (last 3)  Recent Labs  08/22/20 0003 08/22/20 0344 08/22/20 0803  GLUCAP 71 80 70    Resolved Hospital Problem list   Hypotension from sepsis and hypovolemia Chest Pain Diarrhea  Assessment & Plan:   Acute hypoxic / hypercarbic respiratory failure from Rt upper lobe community acquired pneumonia and acute pulmonary edema  She unfortunately has had an acute decline this am with acute hypoxic and hypercarbic respiratory failure. Likely this is culmination of PNA and critical illness in a frail elderly female.  She is not a candidate for bipap with AMS and her husband does not want to re-intubate her. She appears to be transitioning toward death and is uncomfortable in appearance.  At this point, given  husbands stated goals, it is reasonable to add comfort agents to her plan of care.  Support offered to family.   -NRB for comfort -add PRN morphine for comfort / titrate for normal respiratory rate  -not a candidate for BiPAP -PRN atropine gtt's for increased secretions -husband elects not to proceed with intubation, discussed ramifications of not reintubating patient, he indicates understanding  -DNR  -spiritual care consult for support  A.fib with RVR Acute on Chronic CHF Hypertension -per primary team   Agitated Delirium -supportive care  -ativan for comfort   Dysphagia -comfort feeding as tolerated   Deconditioning -hold further PT / OT efforts  Hypokalemia -per primary   Hearing Loss Poor hearing at baseline -supportive care   Hx Hypothyroidism -per primary     Anemia of critical illness -per primary   Essential tremor -per primary   Urine retention -per primary       Canary Brim, MSN, APRN, NP-C, AGACNP-BC [facility] September 14, 2020, 9:33 AM   Please see [email or web address] for pager details.   From 7A-7P if no response, please call [phone] After hours, please call ELink [phone]

## 2020-08-29 DEATH — deceased

## 2021-08-27 IMAGING — DX DG CHEST 1V PORT
1 series · 1 of 1 positions shown · non-contrast
Comparison: 07/25/2017

CLINICAL DATA: Palpitations and shortness of breath

EXAM:
PORTABLE CHEST 1 VIEW

[chest ap]
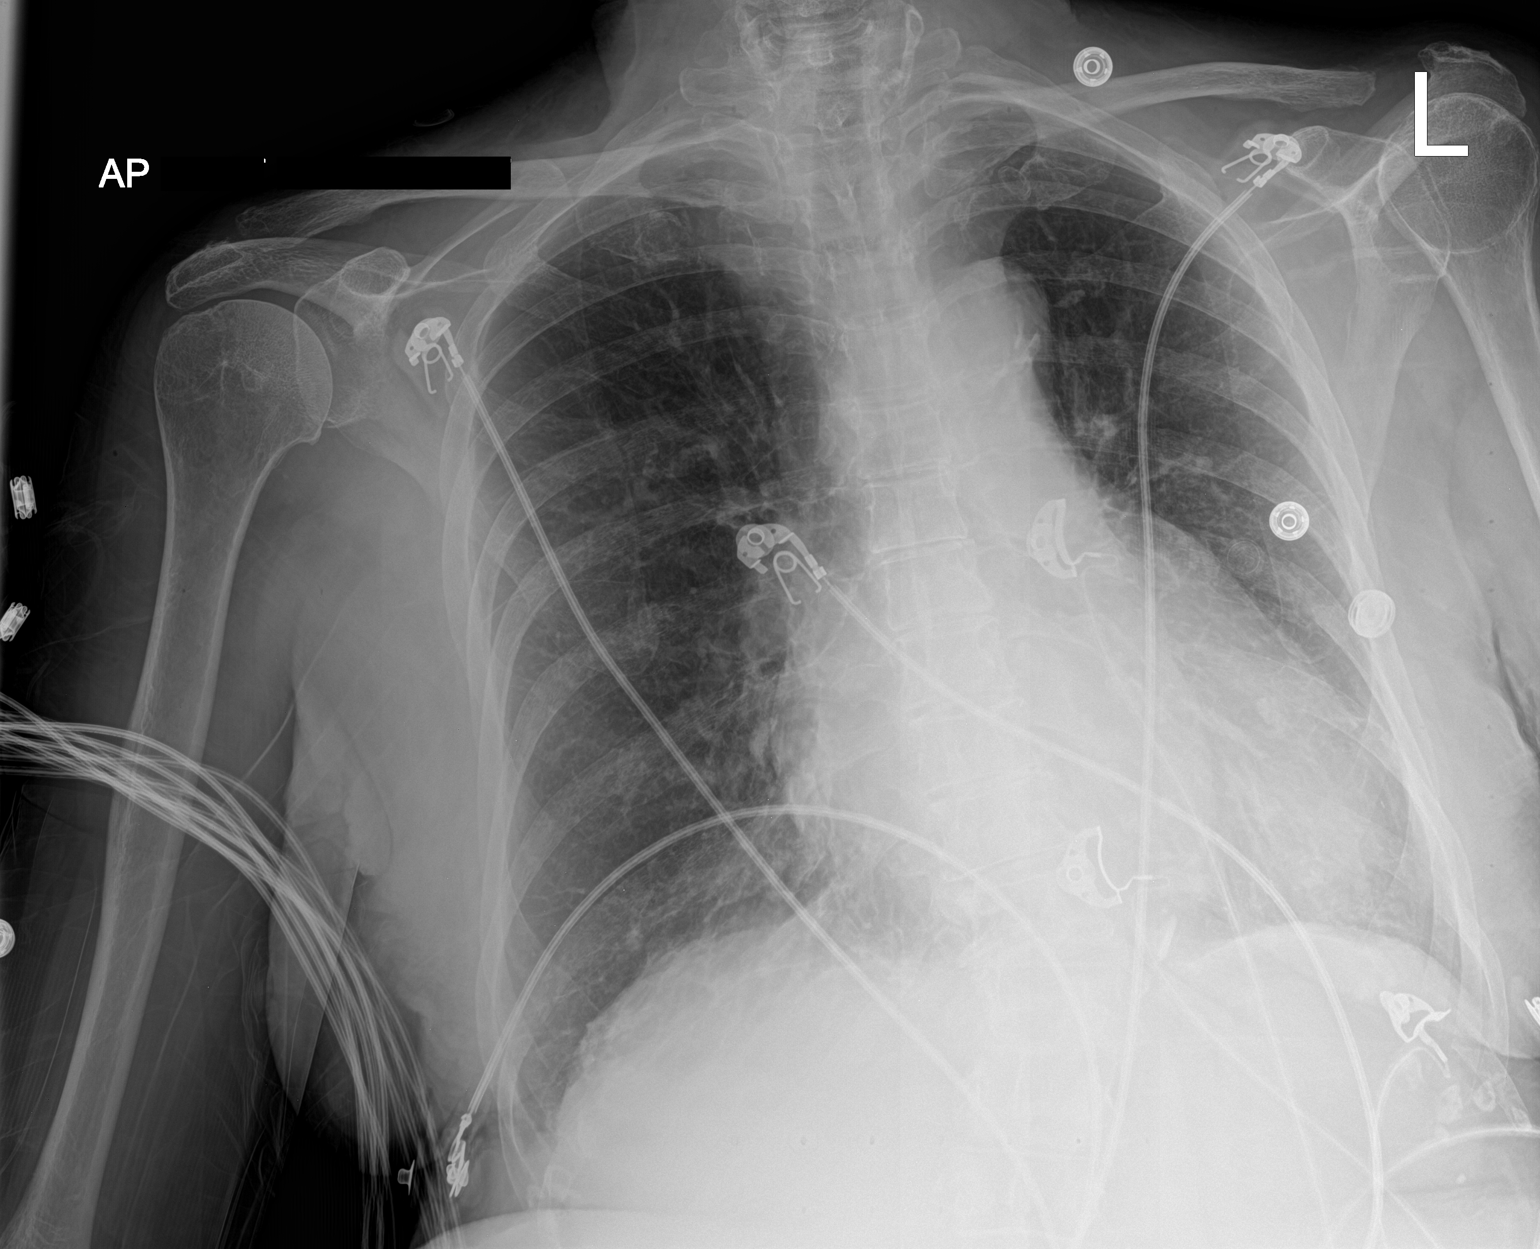

[1 of 1 positions shown; findings below may reference images not displayed]

FINDINGS: Chronic cardiomegaly. Prominent markings at the bases similar to
prior. No edema, effusion, or pneumothorax. Artifact from EKG leads.
IMPRESSION: No acute finding when compared to 8815.

## 2021-10-25 IMAGING — DX DG CHEST 1V PORT
1 series · 1 of 1 positions shown · non-contrast
Comparison: [DATE] a.m.

CLINICAL DATA: Hypoxia, atrial fibrillation

EXAM:
PORTABLE CHEST 1 VIEW

[chest]
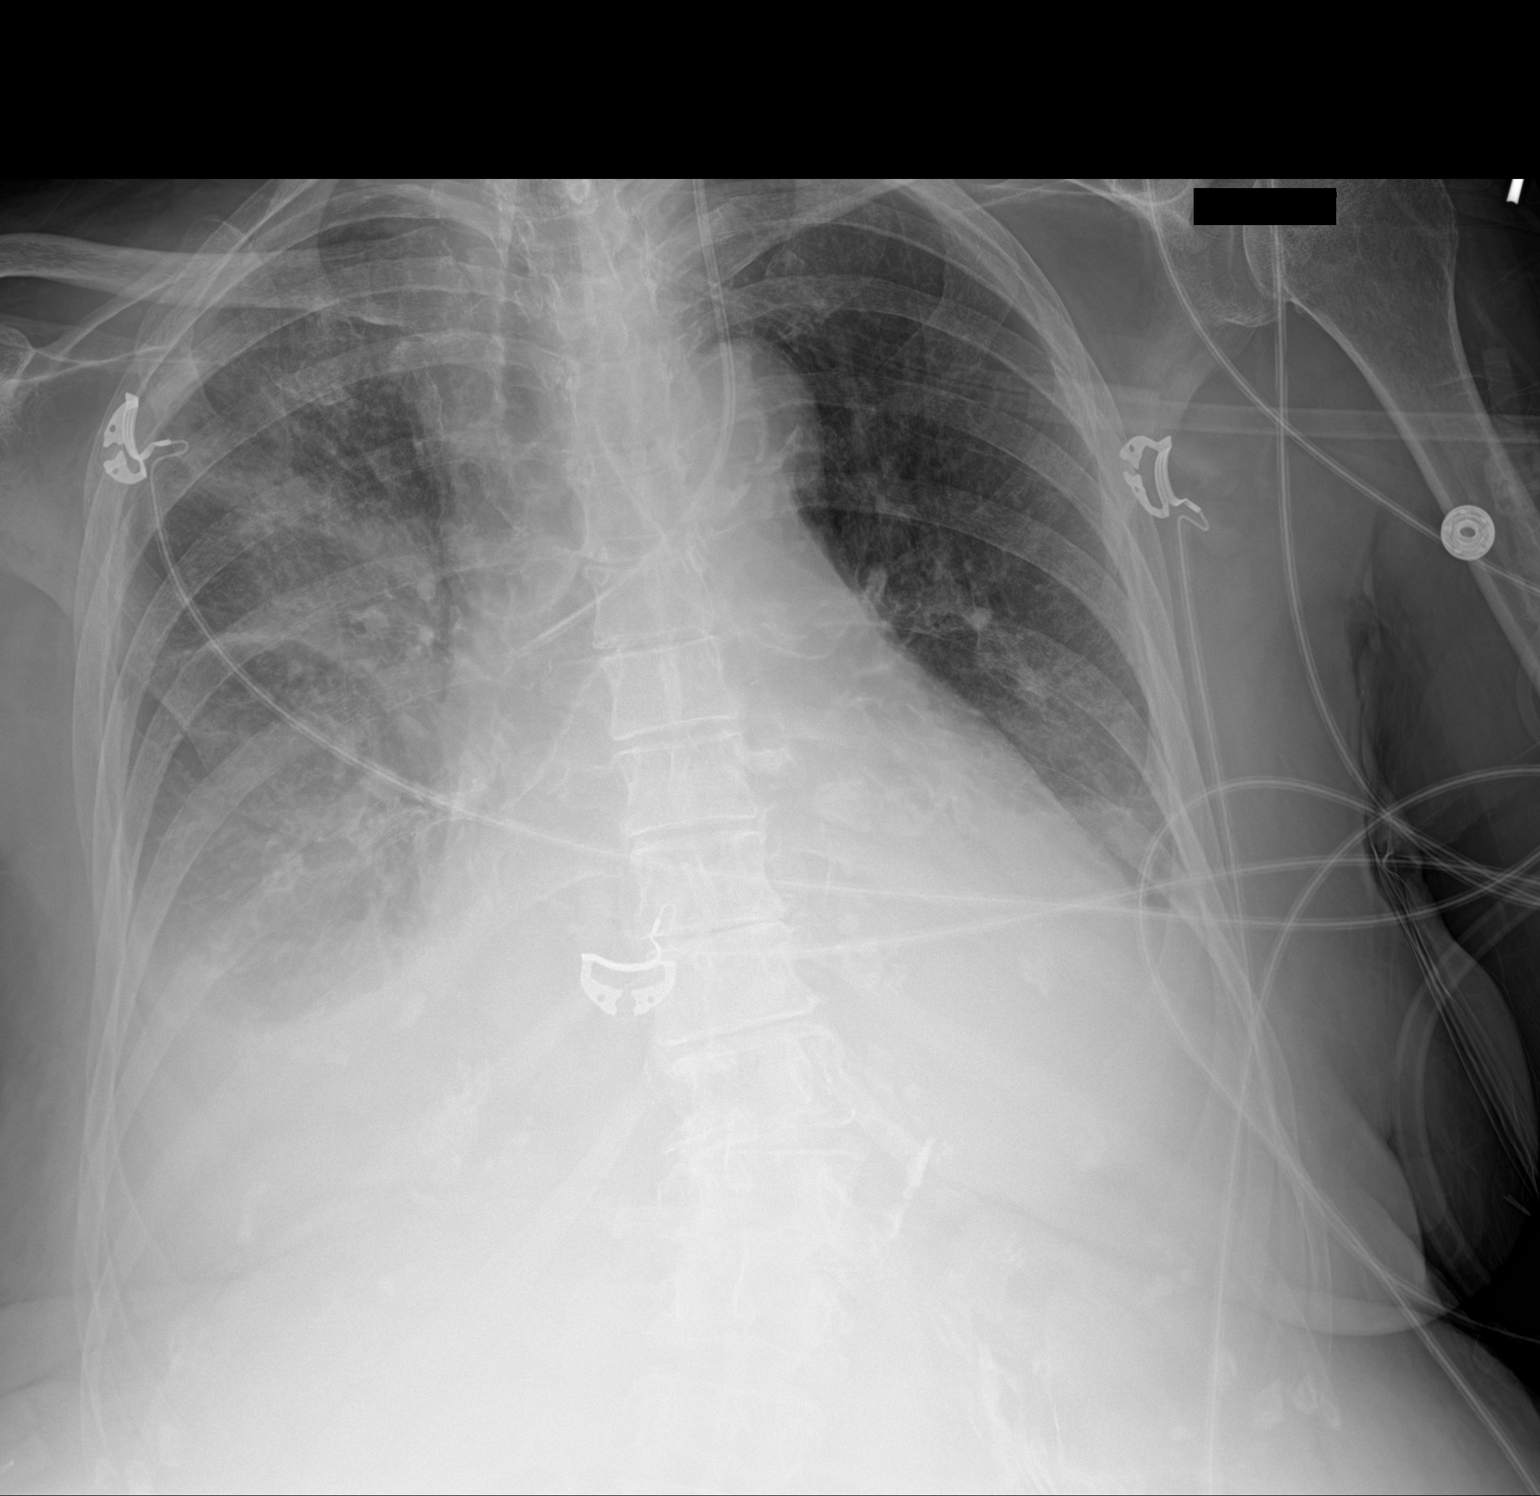

[1 of 1 positions shown; findings below may reference images not displayed]

FINDINGS: Lung volumes are small, but are symmetric and are stable since prior
examination. Small bilateral pleural effusions are again identified,
right greater than left, with associated bibasilar compressive
atelectasis. Superimposed pulmonary infiltrate within the right
perihilar and lower lung zone appears stable since prior
examination, possibly related to underlying infection or aspiration.
No pneumothorax. Left internal jugular central venous catheter tip
again noted overlying the expected superior vena cava. Mild
cardiomegaly is stable. Pulmonary vascularity is normal.
IMPRESSION: Stable bilateral pleural effusions, right greater than left, with
associated bibasilar compressive atelectasis.

Stable asymmetric right perihilar and lower lung zone pulmonary
infiltrate, possibly related to infection or aspiration.
